# Patient Record
Sex: Female | Born: 1975 | Race: White | Hispanic: No | Marital: Married | Smoking: Never smoker
Health system: Southern US, Community
[De-identification: ages and names within clinical notes are randomized; demographics above are authoritative.]

## PROBLEM LIST (undated history)

## (undated) DIAGNOSIS — J349 Unspecified disorder of nose and nasal sinuses: Secondary | ICD-10-CM

## (undated) DIAGNOSIS — R040 Epistaxis: Secondary | ICD-10-CM

## (undated) DIAGNOSIS — M79669 Pain in unspecified lower leg: Secondary | ICD-10-CM

## (undated) DIAGNOSIS — R05 Cough: Secondary | ICD-10-CM

## (undated) DIAGNOSIS — F329 Major depressive disorder, single episode, unspecified: Secondary | ICD-10-CM

## (undated) DIAGNOSIS — R059 Cough, unspecified: Secondary | ICD-10-CM

## (undated) DIAGNOSIS — F32A Depression, unspecified: Secondary | ICD-10-CM

## (undated) DIAGNOSIS — M792 Neuralgia and neuritis, unspecified: Secondary | ICD-10-CM

## (undated) DIAGNOSIS — F419 Anxiety disorder, unspecified: Secondary | ICD-10-CM

## (undated) DIAGNOSIS — K219 Gastro-esophageal reflux disease without esophagitis: Secondary | ICD-10-CM

## (undated) DIAGNOSIS — R519 Headache, unspecified: Secondary | ICD-10-CM

## (undated) DIAGNOSIS — R51 Headache: Secondary | ICD-10-CM

## (undated) DIAGNOSIS — K589 Irritable bowel syndrome without diarrhea: Secondary | ICD-10-CM

## (undated) HISTORY — DX: Headache, unspecified: R51.9

## (undated) HISTORY — DX: Gastro-esophageal reflux disease without esophagitis: K21.9

## (undated) HISTORY — DX: Irritable bowel syndrome, unspecified: K58.9

## (undated) HISTORY — DX: Anxiety disorder, unspecified: F41.9

## (undated) HISTORY — PX: MOUTH SURGERY: SHX715

## (undated) HISTORY — DX: Cough: R05

## (undated) HISTORY — DX: Epistaxis: R04.0

## (undated) HISTORY — PX: OTHER SURGICAL HISTORY: SHX169

## (undated) HISTORY — DX: Cough, unspecified: R05.9

## (undated) HISTORY — DX: Unspecified disorder of nose and nasal sinuses: J34.9

## (undated) HISTORY — DX: Pain in unspecified lower leg: M79.669

## (undated) HISTORY — DX: Headache: R51

---

## 1998-10-14 ENCOUNTER — Encounter: Admission: RE | Admit: 1998-10-14 | Discharge: 1998-10-14 | Payer: Self-pay | Admitting: *Deleted

## 1999-12-23 ENCOUNTER — Encounter: Admission: RE | Admit: 1999-12-23 | Discharge: 1999-12-23 | Payer: Self-pay | Admitting: Family Medicine

## 1999-12-23 ENCOUNTER — Encounter: Payer: Self-pay | Admitting: Family Medicine

## 2000-02-25 ENCOUNTER — Other Ambulatory Visit: Admission: RE | Admit: 2000-02-25 | Discharge: 2000-02-25 | Payer: Self-pay | Admitting: *Deleted

## 2000-03-02 ENCOUNTER — Ambulatory Visit (HOSPITAL_COMMUNITY): Admission: RE | Admit: 2000-03-02 | Discharge: 2000-03-02 | Payer: Self-pay | Admitting: Obstetrics and Gynecology

## 2000-12-04 ENCOUNTER — Inpatient Hospital Stay (HOSPITAL_COMMUNITY): Admission: AD | Admit: 2000-12-04 | Discharge: 2000-12-04 | Payer: Self-pay | Admitting: Obstetrics and Gynecology

## 2001-03-21 ENCOUNTER — Other Ambulatory Visit: Admission: RE | Admit: 2001-03-21 | Discharge: 2001-03-21 | Payer: Self-pay | Admitting: Obstetrics and Gynecology

## 2002-03-11 ENCOUNTER — Inpatient Hospital Stay (HOSPITAL_COMMUNITY): Admission: AD | Admit: 2002-03-11 | Discharge: 2002-03-11 | Payer: Self-pay | Admitting: Obstetrics & Gynecology

## 2002-04-05 ENCOUNTER — Other Ambulatory Visit: Admission: RE | Admit: 2002-04-05 | Discharge: 2002-04-05 | Payer: Self-pay | Admitting: Obstetrics and Gynecology

## 2002-04-08 ENCOUNTER — Inpatient Hospital Stay (HOSPITAL_COMMUNITY): Admission: AD | Admit: 2002-04-08 | Discharge: 2002-04-08 | Payer: Self-pay | Admitting: Obstetrics and Gynecology

## 2002-05-06 ENCOUNTER — Inpatient Hospital Stay (HOSPITAL_COMMUNITY): Admission: AD | Admit: 2002-05-06 | Discharge: 2002-05-06 | Payer: Self-pay | Admitting: Obstetrics and Gynecology

## 2002-07-16 ENCOUNTER — Other Ambulatory Visit: Admission: RE | Admit: 2002-07-16 | Discharge: 2002-07-16 | Payer: Self-pay | Admitting: Obstetrics and Gynecology

## 2002-11-05 ENCOUNTER — Ambulatory Visit (HOSPITAL_COMMUNITY): Admission: RE | Admit: 2002-11-05 | Discharge: 2002-11-05 | Payer: Self-pay | Admitting: Obstetrics and Gynecology

## 2003-02-04 ENCOUNTER — Inpatient Hospital Stay (HOSPITAL_COMMUNITY): Admission: AD | Admit: 2003-02-04 | Discharge: 2003-02-07 | Payer: Self-pay | Admitting: Obstetrics and Gynecology

## 2003-02-08 ENCOUNTER — Encounter: Admission: RE | Admit: 2003-02-08 | Discharge: 2003-03-10 | Payer: Self-pay | Admitting: Obstetrics and Gynecology

## 2003-03-11 ENCOUNTER — Encounter: Admission: RE | Admit: 2003-03-11 | Discharge: 2003-04-10 | Payer: Self-pay | Admitting: Obstetrics and Gynecology

## 2003-03-24 ENCOUNTER — Other Ambulatory Visit: Admission: RE | Admit: 2003-03-24 | Discharge: 2003-03-24 | Payer: Self-pay | Admitting: Obstetrics and Gynecology

## 2003-05-11 ENCOUNTER — Encounter: Admission: RE | Admit: 2003-05-11 | Discharge: 2003-06-10 | Payer: Self-pay | Admitting: Obstetrics and Gynecology

## 2004-01-26 ENCOUNTER — Other Ambulatory Visit: Admission: RE | Admit: 2004-01-26 | Discharge: 2004-01-26 | Payer: Self-pay | Admitting: Obstetrics and Gynecology

## 2004-08-06 ENCOUNTER — Other Ambulatory Visit: Admission: RE | Admit: 2004-08-06 | Discharge: 2004-08-06 | Payer: Self-pay | Admitting: Obstetrics and Gynecology

## 2005-04-04 ENCOUNTER — Other Ambulatory Visit: Admission: RE | Admit: 2005-04-04 | Discharge: 2005-04-04 | Payer: Self-pay | Admitting: Obstetrics and Gynecology

## 2012-05-25 ENCOUNTER — Other Ambulatory Visit: Payer: Self-pay | Admitting: Obstetrics and Gynecology

## 2012-05-25 DIAGNOSIS — N644 Mastodynia: Secondary | ICD-10-CM

## 2014-06-11 ENCOUNTER — Inpatient Hospital Stay: Admission: RE | Admit: 2014-06-11 | Payer: Self-pay | Source: Ambulatory Visit

## 2014-06-11 ENCOUNTER — Ambulatory Visit (INDEPENDENT_AMBULATORY_CARE_PROVIDER_SITE_OTHER): Payer: No Typology Code available for payment source

## 2014-06-11 VITALS — BP 116/76 | HR 112 | Resp 18

## 2014-06-11 DIAGNOSIS — M722 Plantar fascial fibromatosis: Secondary | ICD-10-CM

## 2014-06-11 DIAGNOSIS — M79609 Pain in unspecified limb: Secondary | ICD-10-CM

## 2014-06-11 DIAGNOSIS — M79606 Pain in leg, unspecified: Secondary | ICD-10-CM

## 2014-06-11 MED ORDER — MELOXICAM 15 MG PO TABS: 15.0000 mg | ORAL_TABLET | Freq: Every day | ORAL | Status: DC

## 2014-06-11 NOTE — Progress Notes (Signed)
   Subjective:    Patient ID: Milus HeightAngela L Fandrich, female    DOB: [birth year], 38 y.o.   MRN: [id number]  HPI RIGHT HEEL HAS BEEN GOING ON FOR ABOUT 2 MONTHS AND I STRETCH IT AND ICE AND BURNS AND THROBS AND SORE AND TENDER AND HURTS ON BOTTOM AND SIDE AND THE ARCH AREA     Review of Systems  HENT: Positive for sinus pressure.   Respiratory: Positive for cough and chest tightness.   Cardiovascular:       Calf pain with walking   Allergic/Immunologic: Positive for environmental allergies.  All other systems reviewed and are negative.      Objective:   Physical Exam 38 year old white female well-developed well-nourished oriented x3 presents at this time for complaint of right foot and heel pain and arch pain. Been going on for at least 2 more months she's tried wearing cam boot that she got for mother who is treated for fasciitis. Been doing ice and stretching providing temporary relief however continues to have pain currently wearing a pair of flip-flop sandals [employer] at work or goes barefoot around the house.  Lower extremity objective findings vascular status is intact with pedal pulses palpable DP is two over four bilateral capillary refill time 3 seconds all digits epicritic and proprioceptive sensations intact and symmetric there is normal plantar response DTRs not listed dermatologically skin color pigment normal hair growth absent nails somewhat criptotic there is no open wounds ulcerations no signs of infection at this time orthopedic biomechanical exam reveals pain tenderness on palpation medial band plantar fascia medial calcaneal tubercle and arch area mid arch x-rays reveal no inferior calcaneal spurring thickening the fashion identified patient is also having a lot of pain along the lateral fifth metatarsal head and base possibly due to history gait changes walking outside of the foot also having some calf her Achilles pain posse do compensatory gait changes as well       Assessment  & Plan:  Assessment plantar fasciitis/heel spur syndrome right foot plan at this time patient placed in fascial strapping the right foot maintained for 5 days as instructed [facility] 150 mg tablet daily dispensed 30 with one refill. Patient also apply ice as instructed and maintain a good stable shoe suggested crocs for around the house no barefoot or flimsy shoes or flip-flops reappointed in 2-3 weeks for followup reevaluation may be candidate in the future for orthoses next  Alvan Dameichard Anastashia Westerfeld DPM

## 2014-06-11 NOTE — Patient Instructions (Signed)
Plantar Fasciitis Plantar fasciitis is a common condition that causes foot pain. It is soreness (inflammation) of the band of tough fibrous tissue on the bottom of the foot that runs from the heel bone (calcaneus) to the ball of the foot. The cause of this soreness may be from excessive standing, poor fitting shoes, running on hard surfaces, being overweight, having an abnormal walk, or overuse (this is common in runners) of the painful foot or feet. It is also common in aerobic exercise dancers and ballet dancers. SYMPTOMS  Most people with plantar fasciitis complain of:  Severe pain in the morning on the bottom of their foot especially when taking the first steps out of bed. This pain recedes after a few minutes of walking.  Severe pain is experienced also during walking following a long period of inactivity.  Pain is worse when walking barefoot or up stairs DIAGNOSIS   Your caregiver will diagnose this condition by examining and feeling your foot.  Special tests such as X-rays of your foot, are usually not needed. PREVENTION   Consult a sports medicine professional before beginning a new exercise program.  Walking programs offer a good workout. With walking there is a lower chance of overuse injuries common to runners. There is less impact and less jarring of the joints.  Begin all new exercise programs slowly. If problems or pain develop, decrease the amount of time or distance until you are at a comfortable level.  Wear good shoes and replace them regularly.  Stretch your foot and the heel cords at the back of the ankle (Achilles tendon) both before and after exercise.  Run or exercise on even surfaces that are not hard. For example, asphalt is better than pavement.  Do not run barefoot on hard surfaces.  If using a treadmill, vary the incline.  Do not continue to workout if you have foot or joint problems. Seek professional help if they do not improve. HOME CARE INSTRUCTIONS     Avoid activities that cause you pain until you recover.  Use ice or cold packs on the problem or painful areas after working out.  Only take over-the-counter or prescription medicines for pain, discomfort, or fever as directed by your caregiver.  Soft shoe inserts or athletic shoes with air or gel sole cushions may be helpful.  If problems continue or become more severe, consult a sports medicine caregiver or your own health care provider. Cortisone is a potent anti-inflammatory medication that may be injected into the painful area. You can discuss this treatment with your caregiver. MAKE SURE YOU:   Understand these instructions.  Will watch your condition.  Will get help right away if you are not doing well or get worse. Document Released: 08/09/2001 Document Revised: 02/06/2012 Document Reviewed: 10/08/2008 Exit[employer] Patient Information 2015 [employer]. This information is not intended to replace advice given to you by your health care provider. Make sure you discuss any questions you have with your health care provider.    ICE INSTRUCTIONS  Apply ice or cold pack to the affected area at least 3 times a day for 10-15 minutes each time.  You should also use ice after prolonged activity or vigorous exercise.  Do not apply ice longer than 20 minutes at one time.  Always keep a cloth between your skin and the ice pack to prevent burns.  Being consistent and following these instructions will help control your symptoms.  We suggest you purchase a gel ice pack because they are   reusable and do bit leak.  Some of them are designed to wrap around the area.  Use the method that works best for you.  Here are some other suggestions for icing.   Use a frozen bag of peas or corn-inexpensive and molds well to your body, usually stays frozen for 10 to 20 minutes.  Wet a towel with cold water and squeeze out the excess until it's damp.  Place in a bag in the freezer for 20 minutes. Then remove  and use. 

## 2014-07-09 ENCOUNTER — Ambulatory Visit (INDEPENDENT_AMBULATORY_CARE_PROVIDER_SITE_OTHER): Payer: No Typology Code available for payment source

## 2014-07-09 VITALS — BP 137/83 | HR 131 | Resp 18

## 2014-07-09 DIAGNOSIS — M79609 Pain in unspecified limb: Secondary | ICD-10-CM

## 2014-07-09 DIAGNOSIS — M722 Plantar fascial fibromatosis: Secondary | ICD-10-CM

## 2014-07-09 DIAGNOSIS — M79606 Pain in leg, unspecified: Secondary | ICD-10-CM

## 2014-07-09 MED ORDER — TRIAMCINOLONE ACETONIDE 10 MG/ML IJ SUSP: 10.0000 mg | Freq: Once | INTRAMUSCULAR | Status: AC

## 2014-07-09 NOTE — Patient Instructions (Signed)

## 2014-07-09 NOTE — Progress Notes (Signed)
   Subjective:    Patient ID: Milus HeightAngela L Woodmansee, female    DOB: 06/12/1976, 38 y.o.   MRN: [id number]  HPI MY RIGHT HEEL IS STILL SORE AND TENDER AND IS MORE IN THE HEEL AND THE MEDICINE IS MAKING MY STOMACH HURT AND I CAN'T AFFORD TO BUY A BUNCH OF SHOES    Review of Systems no new findings or systemic changes noted     Objective:   Physical Exam Lower extremity objective findings unchanged pedal pulses are palpable patient case normal taping was done for better taking with full 5 days [place] is afterwards last different shoes continues to have pain more in the right foot than left medial calcaneal tubercle medial arch area right patient again pedal pulses palpable epicritic sensations intact biomechanically significant promontory changes of pain tenderness the plantar fascial insertion and medial calcaneal tubercle. At this time recommended more aggressive treatment medications as the NSAIDs are causing GI upset recommend a steroid injection which was delivered today tender with Kenalog 20 mg Marcaine plain infiltrated to the inferior calcaneal area right heel.       Assessment & Plan:  Assessment plantar fasciitis/heel spur syndrome response to taping should respond to functional orthoses insurance does not cover orthotics this time we'll range for a power step orthotic we do not have appropriate orthotic for her the soft tissue pickup orthotic either later today or tomorrow the [place] office as she travels through [place] for her work. Will get partially power step orthotics dispensed in [place] either today or tomorrow as instructed orthotic instruction sheet given at this time after an injection of Kenalog 10 mg infiltrated to the radial recommended ice to the affected area recheck in one to 2 months for orthotic check adjustments if needed also continue maintain a good stable shoe preferably lace up or athletic rather than flats  Alvan Dameichard Laken Lobato DPM

## 2014-10-02 ENCOUNTER — Other Ambulatory Visit: Payer: Self-pay | Admitting: Obstetrics and Gynecology

## 2014-10-03 LAB — CYTOLOGY - PAP

## 2015-08-25 ENCOUNTER — Telehealth: Payer: Self-pay | Admitting: Physician Assistant

## 2015-08-25 NOTE — Telephone Encounter (Signed)
Patient contacted office requesting lab result

## 2015-08-27 NOTE — Telephone Encounter (Signed)
I spoke with patient about her lab results and she expressed understanding. 

## 2016-06-14 ENCOUNTER — Ambulatory Visit: Payer: Self-pay | Admitting: Physician Assistant

## 2016-06-14 VITALS — BP 119/70 | HR 120 | Temp 98.7°F

## 2016-06-14 DIAGNOSIS — M545 Low back pain, unspecified: Secondary | ICD-10-CM

## 2016-06-14 DIAGNOSIS — R300 Dysuria: Secondary | ICD-10-CM

## 2016-06-14 NOTE — Progress Notes (Signed)
S: left low back pain    No other sx O: urine negative A: muscle skeletal pain P: ibu tid and heat/ice to back

## 2016-06-15 LAB — POCT URINALYSIS DIPSTICK
Bilirubin, UA: NEGATIVE
Blood, UA: NEGATIVE
GLUCOSE UA: NEGATIVE
KETONES UA: NEGATIVE
Leukocytes, UA: NEGATIVE
Nitrite, UA: NEGATIVE
PROTEIN UA: NEGATIVE
SPEC GRAV UA: 1.02
Urobilinogen, UA: 0.2
pH, UA: 6

## 2016-10-14 ENCOUNTER — Encounter: Payer: Self-pay | Admitting: Physician Assistant

## 2016-10-14 ENCOUNTER — Ambulatory Visit: Payer: Self-pay | Admitting: Physician Assistant

## 2016-10-14 VITALS — BP 132/88 | HR 80 | Temp 98.2°F

## 2016-10-14 DIAGNOSIS — M5440 Lumbago with sciatica, unspecified side: Secondary | ICD-10-CM

## 2016-10-14 MED ORDER — CYCLOBENZAPRINE HCL 5 MG PO TABS: 5.0000 mg | ORAL_TABLET | Freq: Three times a day (TID) | ORAL | 1 refills | Status: AC | PRN

## 2016-10-14 MED ORDER — PREDNISONE 10 MG (48) PO TBPK: ORAL_TABLET | Freq: Every day | ORAL | 0 refills | Status: DC

## 2016-10-14 MED ORDER — KETOROLAC TROMETHAMINE 60 MG/2ML IM SOLN
60.0000 mg | Freq: Once | INTRAMUSCULAR | Status: AC
Start: 2016-10-14 — End: 2016-10-14
  Administered 2016-10-14: 60 mg via INTRAMUSCULAR

## 2016-10-14 NOTE — Patient Instructions (Addendum)
Sciatica Introduction Sciatica is pain, numbness, weakness, or tingling along your sciatic nerve. The sciatic nerve starts in the lower back and goes down the back of each leg. Sciatica happens when this nerve is pinched or has pressure put on it. Sciatica usually goes away on its own or with treatment. Sometimes, sciatica may keep coming back (recur). Follow these instructions at home: Medicines  Take over-the-counter and prescription medicines only as told by your doctor.  Do not drive or use heavy machinery while taking prescription pain medicine. Managing pain  If directed, put ice on the affected area.  Put ice in a plastic bag.  Place a towel between your skin and the bag.  Leave the ice on for 20 minutes, 2-3 times a day.  After icing, apply heat to the affected area before you exercise or as often as told by your doctor. Use the heat source that your doctor tells you to use, such as a moist heat pack or a heating pad.  Place a towel between your skin and the heat source.  Leave the heat on for 20-30 minutes.  Remove the heat if your skin turns bright red. This is especially important if you are unable to feel pain, heat, or cold. You may have a greater risk of getting burned. Activity  Return to your normal activities as told by your doctor. Ask your doctor what activities are safe for you.  Avoid activities that make your sciatica worse.  Take short rests during the day. Rest in a lying or standing position. This is usually better than sitting to rest.  When you rest for a long time, do some physical activity or stretching between periods of rest.  Avoid sitting for a long time without moving. Get up and move around at least one time each hour.  Exercise and stretch regularly, as told by your doctor.  Do not lift anything that is heavier than 10 lb (4.5 kg) while you have symptoms of sciatica.  Avoid lifting heavy things even when you do not have symptoms.  Avoid  lifting heavy things over and over.  When you lift objects, always lift in a way that is safe for your body. To do this, you should:  Bend your knees.  Keep the object close to your body.  Avoid twisting. General instructions  Use good posture.  Avoid leaning forward when you are sitting.  Avoid hunching over when you are standing.  Stay at a healthy weight.  Wear comfortable shoes that support your feet. Avoid wearing high heels.  Avoid sleeping on a mattress that is too soft or too hard. You might have less pain if you sleep on a mattress that is firm enough to support your back.  Keep all follow-up visits as told by your doctor. This is important. Contact a doctor if:  You have pain that:  Wakes you up when you are sleeping.  Gets worse when you lie down.  Is worse than the pain you have had in the past.  Lasts longer than 4 weeks.  You lose weight for without trying. Get help right away if:  You cannot control when you pee (urinate) or poop (have a bowel movement).  You have weakness in any of these areas and it gets worse.  Lower back.  Lower belly (pelvis).  Butt (buttocks).  Legs.  You have redness or swelling of your back.  You have a burning feeling when you pee. This information is not intended to   replace advice given to you by your health care provider. Make sure you discuss any questions you have with your health care provider. Document Released: 08/23/2008 Document Revised: 04/21/2016 Document Reviewed: 07/24/2015  2017 [employer]  Back Exercises Introduction If you have pain in your back, do these exercises 2-3 times each day or as told by your doctor. When the pain goes away, do the exercises once each day, but repeat the steps more times for each exercise (do more repetitions). If you do not have pain in your back, do these exercises once each day or as told by your doctor. Exercises Single Knee to Chest  Do these steps 3-5 times in a row  for each leg: 1. Lie on your back on a firm bed or the floor with your legs stretched out. 2. Bring one knee to your chest. 3. Hold your knee to your chest by grabbing your knee or thigh. 4. Pull on your knee until you feel a gentle stretch in your lower back. 5. Keep doing the stretch for 10-30 seconds. 6. Slowly let go of your leg and straighten it. Pelvic Tilt  Do these steps 5-10 times in a row: 1. Lie on your back on a firm bed or the floor with your legs stretched out. 2. Bend your knees so they point up to the ceiling. Your feet should be flat on the floor. 3. Tighten your lower belly (abdomen) muscles to press your lower back against the floor. This will make your tailbone point up to the ceiling instead of pointing down to your feet or the floor. 4. Stay in this position for 5-10 seconds while you gently tighten your muscles and breathe evenly. Cat-Cow  Do these steps until your lower back bends more easily: 1. Get on your hands and knees on a firm surface. Keep your hands under your shoulders, and keep your knees under your hips. You may put padding under your knees. 2. Let your head hang down, and make your tailbone point down to the floor so your lower back is round like the back of a cat. 3. Stay in this position for 5 seconds. 4. Slowly lift your head and make your tailbone point up to the ceiling so your back hangs low (sags) like the back of a cow. 5. Stay in this position for 5 seconds. Press-Ups  Do these steps 5-10 times in a row: 1. Lie on your belly (face-down) on the floor. 2. Place your hands near your head, about shoulder-width apart. 3. While you keep your back relaxed and keep your hips on the floor, slowly straighten your arms to raise the top half of your body and lift your shoulders. Do not use your back muscles. To make yourself more comfortable, you may change where you place your hands. 4. Stay in this position for 5 seconds. 5. Slowly return to lying flat on  the floor. Bridges  Do these steps 10 times in a row: 1. Lie on your back on a firm surface. 2. Bend your knees so they point up to the ceiling. Your feet should be flat on the floor. 3. Tighten your butt muscles and lift your butt off of the floor until your waist is almost as high as your knees. If you do not feel the muscles working in your butt and the back of your thighs, slide your feet 1-2 inches farther away from your butt. 4. Stay in this position for 3-5 seconds. 5. Slowly lower your butt to   the floor, and let your butt muscles relax. If this exercise is too easy, try doing it with your arms crossed over your chest. Belly Crunches  Do these steps 5-10 times in a row: 1. Lie on your back on a firm bed or the floor with your legs stretched out. 2. Bend your knees so they point up to the ceiling. Your feet should be flat on the floor. 3. Cross your arms over your chest. 4. Tip your chin a little bit toward your chest but do not bend your neck. 5. Tighten your belly muscles and slowly raise your chest just enough to lift your shoulder blades a tiny bit off of the floor. 6. Slowly lower your chest and your head to the floor. Back Lifts  Do these steps 5-10 times in a row: 1. Lie on your belly (face-down) with your arms at your sides, and rest your forehead on the floor. 2. Tighten the muscles in your legs and your butt. 3. Slowly lift your chest off of the floor while you keep your hips on the floor. Keep the back of your head in line with the curve in your back. Look at the floor while you do this. 4. Stay in this position for 3-5 seconds. 5. Slowly lower your chest and your face to the floor. Contact a doctor if:  Your back pain gets a lot worse when you do an exercise.  Your back pain does not lessen 2 hours after you exercise. If you have any of these problems, stop doing the exercises. Do not do them again unless your doctor says it is okay. Get help right away if:  You have  sudden, very bad back pain. If this happens, stop doing the exercises. Do not do them again unless your doctor says it is okay. This information is not intended to replace advice given to you by your health care provider. Make sure you discuss any questions you have with your health care provider. Document Released: 12/17/2010 Document Revised: 04/21/2016 Document Reviewed: 01/08/2015  2017 [employer]vier  

## 2016-10-14 NOTE — Progress Notes (Signed)
S:  C/o low back pain for 5 days, no known injury, pain is worse with movement, increased with bending over, pain is in left buttock and radiates down left leg,  denies numbness, tingling, or changes in bowel/urinary habits,  Using otc meds without relief, naprosyn making her drowsy Remainder ros neg  O:  Vitals wnl, nad, lungs c t a, cv rrr, spine nontender, muscles in lower back spasmed , tender in left buttock, pain reproduced with deep palpation of left buttock,  Neg slr, pt walks without difficulty, no foot drop noted, n/v intact  A: acute back pain with sciatica, muscle spasms  P: use wet heat followed by ice, stretches, return to clinic if not better in 3 t 5 days, return earlier if worsening, rx meds: sterapred ds 10mg  12 d dose pack, flexeril 5mg 

## 2016-11-04 ENCOUNTER — Other Ambulatory Visit: Payer: Self-pay | Admitting: Sports Medicine

## 2016-11-04 DIAGNOSIS — M545 Low back pain, unspecified: Secondary | ICD-10-CM

## 2016-11-04 DIAGNOSIS — M5416 Radiculopathy, lumbar region: Secondary | ICD-10-CM

## 2016-11-13 ENCOUNTER — Ambulatory Visit
Admission: RE | Admit: 2016-11-13 | Discharge: 2016-11-13 | Disposition: A | Discharge status: Home / Self Care | Payer: Managed Care, Other (non HMO) | Source: Ambulatory Visit | Attending: Sports Medicine | Admitting: Sports Medicine

## 2016-11-13 DIAGNOSIS — M545 Low back pain, unspecified: Secondary | ICD-10-CM

## 2016-11-13 DIAGNOSIS — M5416 Radiculopathy, lumbar region: Secondary | ICD-10-CM

## 2017-05-11 ENCOUNTER — Ambulatory Visit: Payer: Self-pay | Admitting: Physician Assistant

## 2017-05-11 ENCOUNTER — Encounter: Payer: Self-pay | Admitting: Physician Assistant

## 2017-05-11 VITALS — BP 125/75 | HR 89 | Temp 98.5°F | Resp 16 | Ht 69.0 in | Wt 309.0 lb

## 2017-05-11 DIAGNOSIS — Z0000 Encounter for general adult medical examination without abnormal findings: Secondary | ICD-10-CM

## 2017-05-11 NOTE — Progress Notes (Signed)
   Subjective:Physical exam    Patient ID: Regina Whitaker, female    DOB: 03-28-1976, 41 y.o.   MRN: [id number]  HPI Patient present for annual exam.   Review of Systems Obesity and chronic back pain    Objective:   Physical Exam Morbid Obesity. HEENT unremarkable. Neck supple w/o adenopathy. Lung CTA and Heart RRR. No obvious upper/lower Extremity deformities. F/E ROM. Abdominal exam limited by patient body habitus. No obvious Spinal deformity. Decrease ROM with flexion of lumbar spinal. Negative straight leg tes. CN II-XII grossly intact.       Assessment & Plan:Well exam  Follow up lab result.

## 2018-02-22 ENCOUNTER — Encounter: Payer: Self-pay | Admitting: Family Medicine

## 2018-02-22 ENCOUNTER — Ambulatory Visit
Admission: RE | Admit: 2018-02-22 | Discharge: 2018-02-22 | Disposition: A | Discharge status: Home / Self Care | Payer: Managed Care, Other (non HMO) | Source: Ambulatory Visit | Attending: Family Medicine | Admitting: Family Medicine

## 2018-02-22 ENCOUNTER — Ambulatory Visit: Payer: Self-pay | Admitting: Family Medicine

## 2018-02-22 VITALS — BP 141/98 | HR 98 | Temp 97.7°F | Resp 18

## 2018-02-22 DIAGNOSIS — R0602 Shortness of breath: Secondary | ICD-10-CM

## 2018-02-22 DIAGNOSIS — J329 Chronic sinusitis, unspecified: Secondary | ICD-10-CM

## 2018-02-22 DIAGNOSIS — R059 Cough, unspecified: Secondary | ICD-10-CM

## 2018-02-22 DIAGNOSIS — R05 Cough: Secondary | ICD-10-CM

## 2018-02-22 DIAGNOSIS — J45901 Unspecified asthma with (acute) exacerbation: Secondary | ICD-10-CM

## 2018-02-22 LAB — POCT INFLUENZA A/B
INFLUENZA B, POC: NEGATIVE
Influenza A, POC: NEGATIVE

## 2018-02-22 MED ORDER — BENZONATATE 100 MG PO CAPS: 100.0000 mg | ORAL_CAPSULE | Freq: Three times a day (TID) | ORAL | 0 refills | Status: DC | PRN

## 2018-02-22 MED ORDER — PREDNISONE 20 MG PO TABS: 40.0000 mg | ORAL_TABLET | Freq: Every day | ORAL | 0 refills | Status: AC

## 2018-02-22 MED ORDER — IPRATROPIUM-ALBUTEROL 0.5-2.5 (3) MG/3ML IN SOLN: 3.0000 mL | Freq: Once | RESPIRATORY_TRACT | Status: AC

## 2018-02-22 MED ORDER — AMOXICILLIN-POT CLAVULANATE 875-125 MG PO TABS: 1.0000 | ORAL_TABLET | Freq: Two times a day (BID) | ORAL | 0 refills | Status: AC

## 2018-02-22 NOTE — Progress Notes (Signed)
Subjective: Cough     Regina Whitaker is a 42 y.o. female who presents for dyspnea, non-productive cough, fatigue, sore throat, fever, nasal congestion with purulent nasal discharge, nausea, facial pressure/dental pressure, body aches, and wheezing for 4 days.  Patient reports in the last 24 hours she has developed wheezing and shortness of breath.  Denies chest or back pain.  Patient reports that on Monday and Tuesday she had a low-grade fever between 100.0 and 101.0.  Reports fever never exceeded 101.0.  Denies recurrence of fever.  The patient has been previously diagnosed with asthma but reports she only has symptoms with exercise and when she gets upper respiratory infections.  Patient does not use her albuterol inhaler otherwise.  Gets good relief with her albuterol inhaler in these circumstances.  Patient has used her albuterol inhaler multiple times a day since the onset of her symptoms 4 days ago.  Patient also reports being outside all day Saturday.  Patient is not sure if she is ever had an asthma exacerbation requiring oral corticosteroids.  Denies ever being hospitalized or intubated for asthma.  Aside from pretreatment for exercise, patient has not used her albuterol inhaler in the last year.  Patient is not taking any other medications for asthma. Upon arrival patient had generalized wheezing and was short of breath.  DuoNeb administered which provided significant relief and improvement in symptoms.  Lungs clear following DuoNeb.  Treatment to date: Albuterol and NyQuil.  Denies rash, vomiting, diarrhea, ear pain, difficulty swallowing, confusion, AMS, initial improvement and then worsening of symptoms. Denies any history of smoking. History of recurrent sinus and/or lung infections: Negative. Antibiotic use in the last 3 months: Negative.  Review of Systems Pertinent items noted in HPI and remainder of comprehensive ROS otherwise negative.    Objective:    Oxygen saturation 98% on room  air  Physical Exam General: Awake, alert, and oriented. No acute distress. Well developed, hydrated and nourished. Appears stated age. Nontoxic appearance.  HEENT:  PND noted.  Mild erythema to posterior oropharynx.  No edema or exudates of pharynx or tonsils. No erythema or bulging of TM.  Mild erythema/edema to nasal mucosa.  Left maxillary sinus tenderness.  Supple neck without adenopathy. Cardiac: Heart rate and rhythm are normal. No murmurs, gallops, or rubs are auscultated. S1 and S2 are heard and are of normal intensity.  Heart rate upon arrival 112.  Repeat assessment 30 minutes following breathing treatment: Heart rate 98/min. Respiratory: No signs of respiratory distress. Lungs clear following DuoNeb. No tachypnea. Able to speak in full sentences without dyspnea. Nonlabored respirations.  Skin: Skin is warm, dry and intact. Appropriate color for ethnicity. No cyanosis noted.   Diagnostic Results: Chest x-ray negative.  Flu swab negative.   Assessment:    Intermittent asthma. Apparent precipitants include exercise and upper respiratory infection. Treatment with DuoNeb was given in the office    Sinusitis.   Plan:    Warning signs of respiratory distress were reviewed with the patient.  Discussed avoidance of precipitants.   5-day course of prednisone prescribed. Patient reports not needing another albuterol inhaler, she has 2 at home.  Educated her on use of this. Prescribed Tessalon Perles to use only if needed to sleep.  Educated her not to overly suppress her cough as this increases her risk for pneumonia. Delayed antibiotic prescription for Augmentin prescribed for sinusitis: Patient given specific instructions on indications to fill this and agrees not to do this unless indicated.  Patient has  tolerated these medications well in the past. Discussed the diagnosis and treatment of sinusitis. Discussed the importance of avoiding unnecessary antibiotic therapy. Suggested  symptomatic OTC remedies. Nasal saline spray for congestion.   Adverse/side effects of all medications discussed. Follow-up with primary care provider. Discussed red flag symptoms and circumstances with which to seek medical care.   New Prescriptions   AMOXICILLIN-CLAVULANATE (AUGMENTIN) 875-125 MG TABLET    Take 1 tablet by mouth 2 (two) times daily for 10 days.   BENZONATATE (TESSALON) 100 MG CAPSULE    Take 1 capsule (100 mg total) by mouth 3 (three) times daily as needed for cough.   PREDNISONE (DELTASONE) 20 MG TABLET    Take 2 tablets (40 mg total) by mouth daily with breakfast for 5 days.

## 2018-09-06 ENCOUNTER — Encounter: Payer: Self-pay | Admitting: Intensive Care

## 2018-09-06 ENCOUNTER — Other Ambulatory Visit: Payer: Self-pay

## 2018-09-06 ENCOUNTER — Emergency Department
Admission: EM | Admit: 2018-09-06 | Discharge: 2018-09-06 | Discharge status: Against Medical Advice | Payer: Managed Care, Other (non HMO) | Attending: Emergency Medicine | Admitting: Emergency Medicine

## 2018-09-06 DIAGNOSIS — R040 Epistaxis: Secondary | ICD-10-CM | POA: Insufficient documentation

## 2018-09-06 DIAGNOSIS — Z5321 Procedure and treatment not carried out due to patient leaving prior to being seen by health care provider: Secondary | ICD-10-CM | POA: Diagnosis not present

## 2018-09-06 HISTORY — DX: Depression, unspecified: F32.A

## 2018-09-06 HISTORY — DX: Major depressive disorder, single episode, unspecified: F32.9

## 2018-09-06 HISTORY — DX: Neuralgia and neuritis, unspecified: M79.2

## 2018-09-06 MED ORDER — OXYMETAZOLINE HCL 0.05 % NA SOLN
NASAL | Status: AC
  Administered 2018-09-06: 1 via NASAL
  Filled 2018-09-06: qty 15

## 2018-09-06 MED ORDER — OXYMETAZOLINE HCL 0.05 % NA SOLN
1.0000 | Freq: Once | NASAL | Status: AC
  Administered 2018-09-06: 1 via NASAL

## 2018-09-06 NOTE — ED Triage Notes (Signed)
PAtient presents with nose bleed that started around 4:15pm. Presented with tampons in nose that was saturated in blood. Nose clamp now in place on patients nose

## 2018-09-06 NOTE — ED Notes (Signed)
Pt here with visitor states that the patient's nose has not bled in 30 minutes and is feeling good and is going home.  Encouraged pt to return to ED if necessary or needed further evaluation.

## 2018-12-12 DIAGNOSIS — K219 Gastro-esophageal reflux disease without esophagitis: Secondary | ICD-10-CM | POA: Insufficient documentation

## 2018-12-12 DIAGNOSIS — F419 Anxiety disorder, unspecified: Secondary | ICD-10-CM | POA: Insufficient documentation

## 2018-12-12 DIAGNOSIS — J4520 Mild intermittent asthma, uncomplicated: Secondary | ICD-10-CM | POA: Insufficient documentation

## 2018-12-28 ENCOUNTER — Other Ambulatory Visit: Payer: Self-pay

## 2019-01-02 ENCOUNTER — Other Ambulatory Visit: Payer: Self-pay

## 2019-02-18 IMAGING — CR DG CHEST 2V
1 series · 2 of 2 positions shown · non-contrast
Comparison: None in PACs

CLINICAL DATA: Cough, shortness of breath, chest congestion,
wheezing, body aches, and fatigue for the past 4 days. History of
asthma. Nonsmoker.

EXAM:
CHEST - 2 VIEW

[Series 1: dg chest 2 view · 0.14mm/px · 2 of 2 slices shown]
[im 1/2]
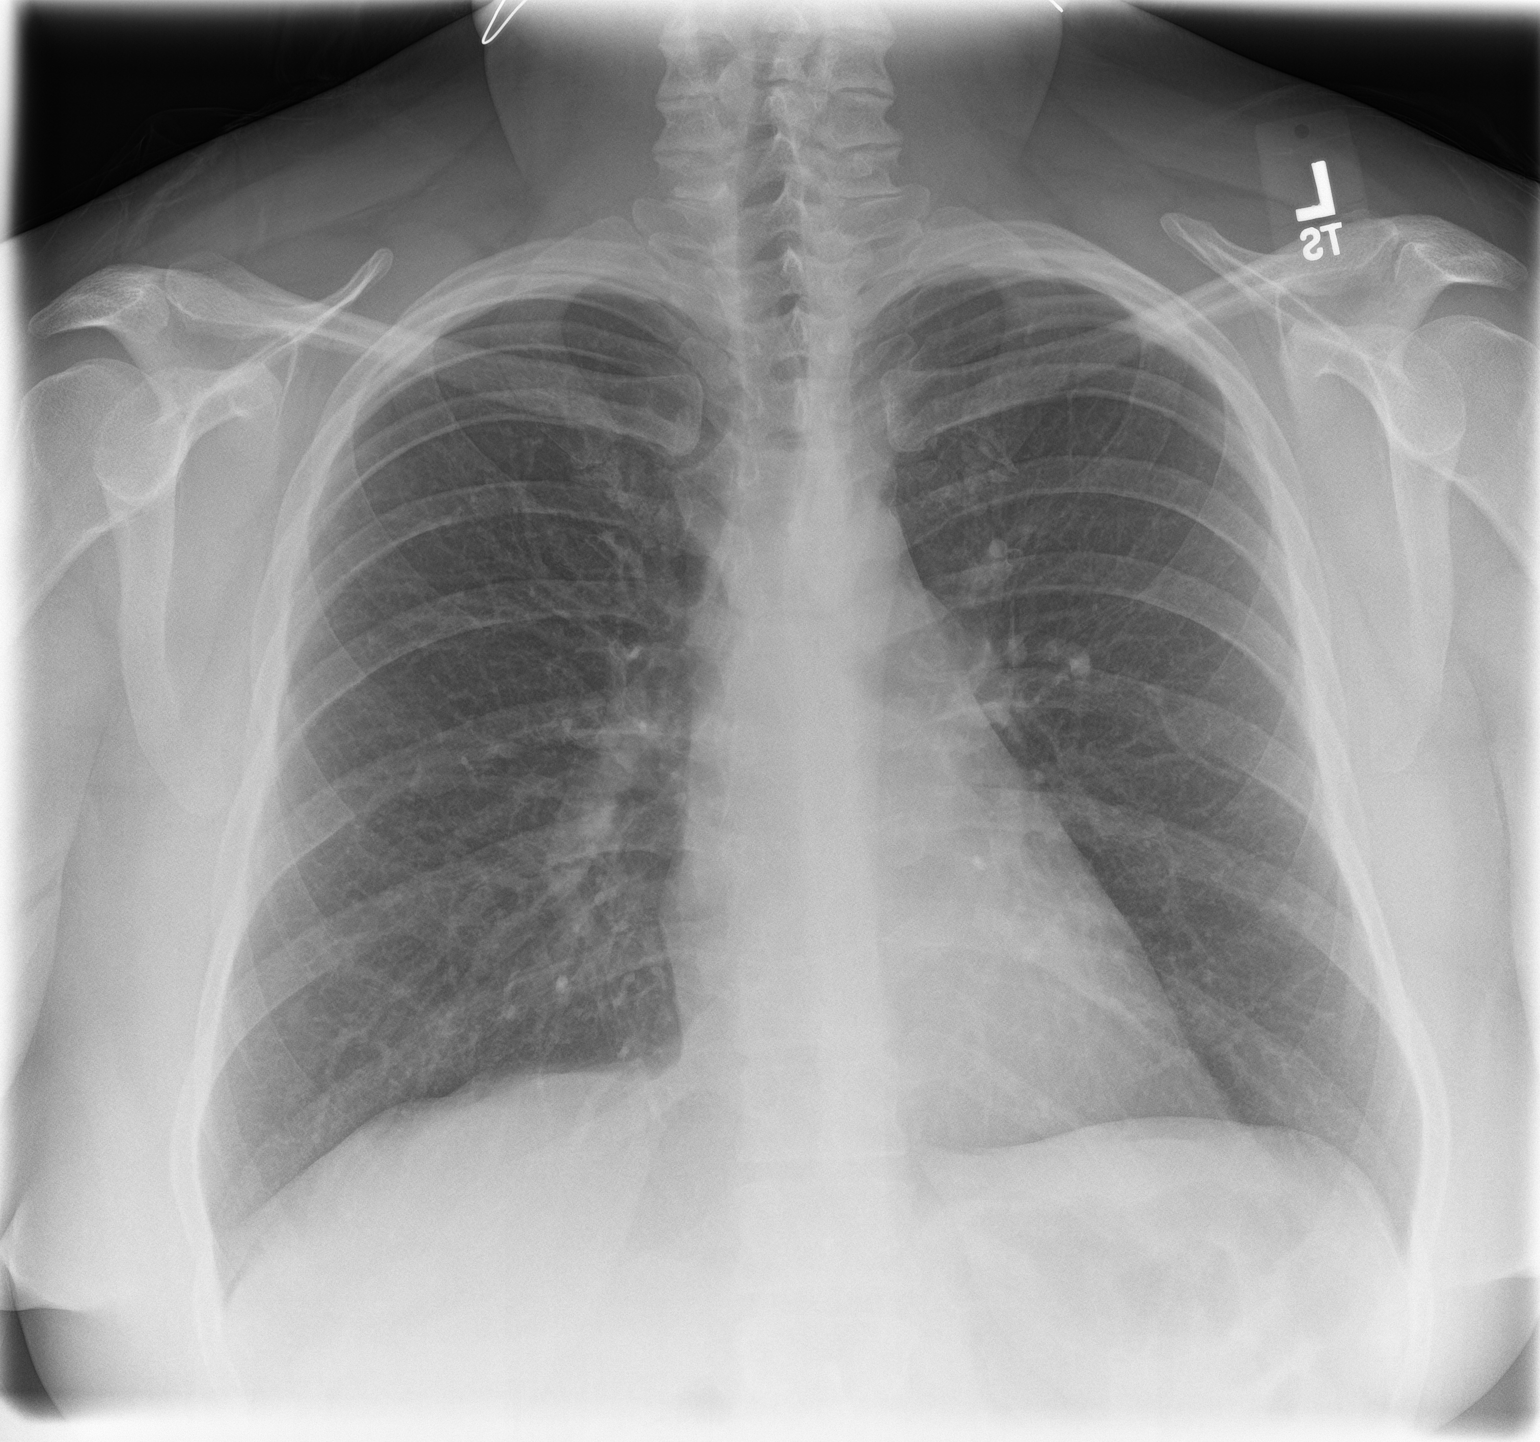
[im 2/2]
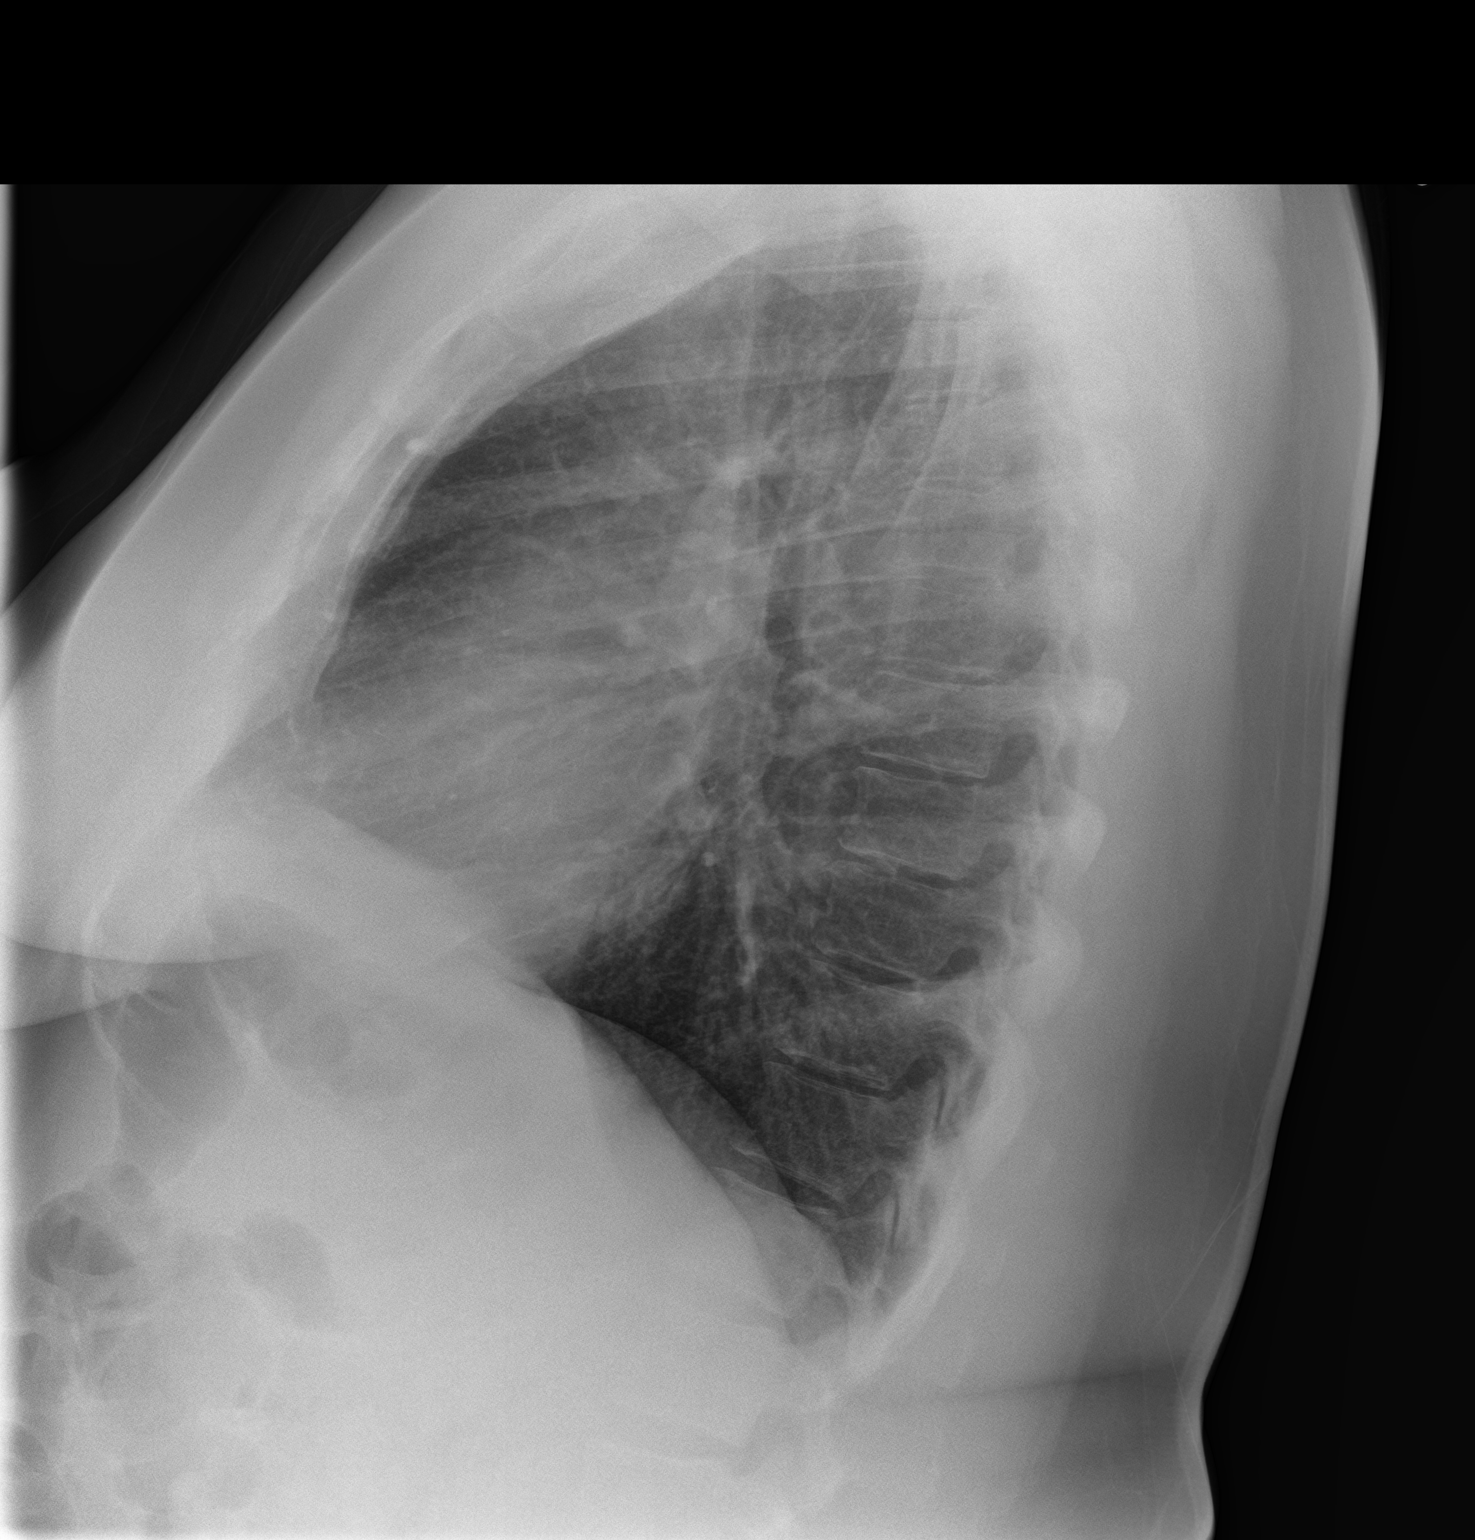

[2 of 2 positions shown; findings below may reference images not displayed]

FINDINGS: The lungs are adequately inflated and clear. The heart and pulmonary
vascularity are normal. The mediastinum is normal in width. The
trachea is midline. There is no pleural effusion. The bony thorax
exhibits no acute abnormality.
IMPRESSION: There is no active cardiopulmonary disease.

## 2019-04-26 DIAGNOSIS — R7303 Prediabetes: Secondary | ICD-10-CM | POA: Insufficient documentation

## 2019-05-20 DIAGNOSIS — R400 Somnolence: Secondary | ICD-10-CM | POA: Insufficient documentation

## 2019-07-04 ENCOUNTER — Encounter: Payer: Self-pay | Admitting: Adult Health

## 2019-07-04 ENCOUNTER — Other Ambulatory Visit: Payer: Self-pay

## 2019-07-04 ENCOUNTER — Ambulatory Visit: Payer: Managed Care, Other (non HMO) | Admitting: Adult Health

## 2019-07-04 VITALS — BP 118/86 | HR 96 | Temp 97.8°F | Ht 69.0 in | Wt 338.0 lb

## 2019-07-04 DIAGNOSIS — Z008 Encounter for other general examination: Secondary | ICD-10-CM | POA: Diagnosis not present

## 2019-07-04 DIAGNOSIS — Z0189 Encounter for other specified special examinations: Secondary | ICD-10-CM

## 2019-07-04 NOTE — Patient Instructions (Signed)
  I will have the office call you on your glucose and cholesterol results when they return if you have not heard within 1 week please call the office.  This biometric physical is a brief physical and the only labs done are glucose and your lipid panel(cholesterol) and is  not a substitute for seeing a primary care provider for a complete annual physical. Please see a primary care physician for routine health maintenance, labs and full physical at least yearly and follow up as recommended by your provider. Provider also recommends if you do not have a primary care provider for patient to establish care as soon as possible .Patient may chose provider of choice. Also gave the [place]  PHYSICIAN/PROVIDER  REFERRAL LINE at [phone] or web site at [email or web address] to help assist with finding a primary care doctor.  Patient verbalizes understanding that his office is acute care only and not a substitute for a primary care or for the management of chronic conditions.    Health Maintenance, Female Adopting a healthy lifestyle and getting preventive care are important in promoting health and wellness. Ask your health care provider about:  The right schedule for you to have regular tests and exams.  Things you can do on your own to prevent diseases and keep yourself healthy. What should I know about diet, weight, and exercise? Eat a healthy diet   Eat a diet that includes plenty of vegetables, fruits, low-fat dairy products, and lean protein.  Do not eat a lot of foods that are high in solid fats, added sugars, or sodium. Maintain a healthy weight Body mass index (BMI) is used to identify weight problems. It estimates body fat based on height and weight. Your health care provider can help determine your BMI and help you achieve or maintain a healthy weight. Get regular exercise Get regular exercise. This is one of the most important things you can do for your health. Most adults should:   Exercise for at least 150 minutes each week. The exercise should increase your heart rate and make you sweat (moderate-intensity exercise).  Do strengthening exercises at least twice a week. This is in addition to the moderate-intensity exercise.  Spend less time sitting. Even light physical activity can be beneficial. Watch cholesterol and blood lipids Have your blood tested for lipids and cholesterol at 44 years of age, then have this test every 5 years. Have your cholesterol levels checked more often if:  Your lipid or cholesterol levels are high.  You are older than 44 years of age.  You are at high risk for heart disease. What should I know about cancer screening? Depending on your health history and family history, you may need to have cancer screening at various ages. This may include screening for:  Breast cancer.  Cervical cancer.  Colorectal cancer.  Skin cancer.  Lung cancer. What should I know about heart disease, diabetes, and high blood pressure? Blood pressure and heart disease  High blood pressure causes heart disease and increases the risk of stroke. This is more likely to develop in people who have high blood pressure readings, are of African descent, or are overweight.  Have your blood pressure checked: ? Every 3-5 years if you are 18-39 years of age. ? Every year if you are 40 years old or older. Diabetes Have regular diabetes screenings. This checks your fasting blood sugar level. Have the screening done:  Once every three years after age 40 if you are   at a normal weight and have a low risk for diabetes.  More often and at a younger age if you are overweight or have a high risk for diabetes. What should I know about preventing infection? Hepatitis B If you have a higher risk for hepatitis B, you should be screened for this virus. Talk with your health care provider to find out if you are at risk for hepatitis B infection. Hepatitis C Testing is  recommended for:  Everyone born from 1945 through 1965.  Anyone with known risk factors for hepatitis C. Sexually transmitted infections (STIs)  Get screened for STIs, including gonorrhea and chlamydia, if: ? You are sexually active and are younger than 44 years of age. ? You are older than 44 years of age and your health care provider tells you that you are at risk for this type of infection. ? Your sexual activity has changed since you were last screened, and you are at increased risk for chlamydia or gonorrhea. Ask your health care provider if you are at risk.  Ask your health care provider about whether you are at high risk for HIV. Your health care provider may recommend a prescription medicine to help prevent HIV infection. If you choose to take medicine to prevent HIV, you should first get tested for HIV. You should then be tested every 3 months for as long as you are taking the medicine. Pregnancy  If you are about to stop having your period (premenopausal) and you may become pregnant, seek counseling before you get pregnant.  Take 400 to 800 micrograms (mcg) of folic acid every day if you become pregnant.  Ask for birth control (contraception) if you want to prevent pregnancy. Osteoporosis and menopause Osteoporosis is a disease in which the bones lose minerals and strength with aging. This can result in bone fractures. If you are 65 years old or older, or if you are at risk for osteoporosis and fractures, ask your health care provider if you should:  Be screened for bone loss.  Take a calcium or vitamin D supplement to lower your risk of fractures.  Be given hormone replacement therapy (HRT) to treat symptoms of menopause. Follow these instructions at home: Lifestyle  Do not use any products that contain nicotine or tobacco, such as cigarettes, e-cigarettes, and chewing tobacco. If you need help quitting, ask your health care provider.  Do not use street drugs.  Do not  share needles.  Ask your health care provider for help if you need support or information about quitting drugs. Alcohol use  Do not drink alcohol if: ? Your health care provider tells you not to drink. ? You are pregnant, may be pregnant, or are planning to become pregnant.  If you drink alcohol: ? Limit how much you use to 0-1 drink a day. ? Limit intake if you are breastfeeding.  Be aware of how much alcohol is in your drink. In the [place] one drink equals one 12 oz bottle of beer (355 mL), one 5 oz glass of wine (148 mL), or one 1 oz glass of hard liquor (44 mL). General instructions  Schedule regular health, dental, and eye exams.  Stay current with your vaccines.  Tell your health care provider if: ? You often feel depressed. ? You have ever been abused or do not feel safe at home. Summary  Adopting a healthy lifestyle and getting preventive care are important in promoting health and wellness.  Follow your health care provider's instructions about   healthy diet, exercising, and getting tested or screened for diseases.  Follow your health care provider's instructions on monitoring your cholesterol and blood pressure. This information is not intended to replace advice given to you by your health care provider. Make sure you discuss any questions you have with your health care provider. Document Released: 05/30/2011 Document Revised: 11/07/2018 Document Reviewed: 11/07/2018 Elsevier Patient Education  2020 Elsevier Inc.  

## 2019-07-04 NOTE — Addendum Note (Signed)
Addended by: Judie Petit on: 07/04/2019 02:35 PM   Modules accepted: Orders

## 2019-07-04 NOTE — Progress Notes (Signed)
[facility] DOB: 43 y.o. MRN: [id number]  Subjective:  Here for Biometric Screen/brief exam Patient is a 43 year old female in no acute distress who comes to the clinic for a brief biometric exam and biometric screening.   She is seeing a nutritionist for her weight with [facility] and reports she has been able to lose 19 lbs since she started this weight loss in March. She sees her primary care provider regularly she reports.  She is walking currently 2 days a week.   Patient  denies any fever, body aches,chills, rash, chest pain, shortness of breath, nausea, vomiting, or diarrhea.    Objective:    Body mass index is 49.91 kg/m.    Blood pressure 118/86, pulse 96, temperature 97.8 F (36.6 C). NAD, obese, well developed, well nourished  HEENT: Within normal limits Neck: Normal, supple, no lymphadenopathy  Heart: Regular rate and rhythm Lungs: Clear no adventitious lung sounds   Assessment: Biometric screen  1. Encounter for biometric screening   2. Encounter for other general examination - brief physical exam with biometric screening not full annual exam       Plan:  Follow up with primary care as needed for chronic and maintenance health care- can be seen in this employee clinic for acute care.   Fasting glucose and lipids. Discussed with patient that today's visit here is a limited biometric screening visit (not a comprehensive exam or management of any chronic problems) Discussed some health issues, including healthy eating habits and exercise. Encouraged to follow-up with PCP for annual comprehensive preventive and wellness care (and if applicable, any chronic issues). Questions invited and answered.  The Biometric exam is a brief physical with labs including glucose and cholesterol. This does not replace a full physical with a primary care provider, and additional recommended labs. This is an acute care  clinic not for maintenance of chronic or long standing conditions.   Provider also recommends if you do not have a primary care provider for patient to establish care promptly.You can choose any provider of your choice at any facility of your choice, the below information is  just a resource to aid in you finding a primary care provider for routine health maintenance.   [facility]  PHYSICIAN/PROVIDER  REFERRAL LINE at [phone]  [email or web address] to help assist with finding a primary care doctor.   Helpful resources below of other primary care office's accepting new patients.   [facility]         [address]  [place]. [place] [address] [phone]  . [facility]    [address] [place], [place] [address] [phone]  . [facility] [address] [place], [place]  [phone]   . [facility]  [address] [place] [address] [phone]    Follow up with primary care as needed for chronic and maintenance health care- can be seen in this employee clinic for acute care.

## 2019-07-05 LAB — GLUCOSE, RANDOM: Glucose: 103 mg/dL — ABNORMAL HIGH (ref 65–99)

## 2019-07-05 LAB — LIPID PANEL WITH LDL/HDL RATIO
Cholesterol, Total: 150 mg/dL (ref 100–199)
HDL: 46 mg/dL (ref >39)
LDL Calculated: 81 mg/dL (ref 0–99)
LDl/HDL Ratio: 1.8 ratio (ref 0.0–3.2)
Triglycerides: 113 mg/dL (ref 0–149)
VLDL Cholesterol Cal: 23 mg/dL (ref 5–40)

## 2019-07-05 NOTE — Progress Notes (Signed)
Patient was called 07/05/19 with results at 815am. Normal cholesterol.Glucose is mildly elevated 103- she is currently under the supervision of a dietician for weight loss. Continue this with diet recommendations and follow up with Myrlene Broker, MD primary care in 3 months or sooner if needed. Patient verbalized understanding of all instructions given and denies any further questions at this time.

## 2020-07-17 ENCOUNTER — Other Ambulatory Visit: Payer: Self-pay

## 2020-07-17 ENCOUNTER — Other Ambulatory Visit: Payer: Managed Care, Other (non HMO)

## 2020-07-17 DIAGNOSIS — Z20822 Contact with and (suspected) exposure to covid-19: Secondary | ICD-10-CM

## 2020-07-19 LAB — SARS-COV-2, NAA 2 DAY TAT

## 2020-07-19 LAB — NOVEL CORONAVIRUS, NAA: SARS-CoV-2, NAA: NOT DETECTED

## 2020-08-27 ENCOUNTER — Ambulatory Visit: Payer: Managed Care, Other (non HMO) | Admitting: Physician Assistant

## 2020-08-27 ENCOUNTER — Other Ambulatory Visit: Payer: Self-pay

## 2020-08-27 ENCOUNTER — Encounter: Payer: Self-pay | Admitting: Physician Assistant

## 2020-08-27 VITALS — BP 140/80 | HR 92 | Temp 98.0°F | Resp 18 | Ht 68.0 in | Wt 340.0 lb

## 2020-08-27 DIAGNOSIS — Z008 Encounter for other general examination: Secondary | ICD-10-CM

## 2020-08-27 DIAGNOSIS — Z0000 Encounter for general adult medical examination without abnormal findings: Secondary | ICD-10-CM

## 2020-08-28 LAB — LIPID PANEL
Chol/HDL Ratio: 3.3 ratio (ref 0.0–4.4)
Cholesterol, Total: 161 mg/dL (ref 100–199)
HDL: 49 mg/dL (ref >39)
LDL Chol Calc (NIH): 91 mg/dL (ref 0–99)
Triglycerides: 116 mg/dL (ref 0–149)
VLDL Cholesterol Cal: 21 mg/dL (ref 5–40)

## 2020-08-28 LAB — GLUCOSE, RANDOM: Glucose: 81 mg/dL (ref 65–99)

## 2020-08-29 ENCOUNTER — Encounter: Payer: Self-pay | Admitting: Physician Assistant

## 2020-08-29 NOTE — Progress Notes (Signed)
Subjective:    Patient ID: Regina Whitaker, female    DOB: [birth year], 44 y.o.   MRN: [id number]  HPI  24 yo F [place] [employer] [place] [employer] , presents for [employer] and brief exam  Has had Covid vacc 1 &2  Will do flu when available Does DDS q 6 mos  PCP Dr Buddy Duty at [facility]  Has and is working with a Weight Management clinic- she reports keeping in close contact. Has had counseling and classes for interventions when eating seems like the only thing to do-- Issue has been long standing. Waxes and wanes with adhering to coping/management skills Has used Ozempic for borderline A1C and for anticipated help with weight loss Lorazepam  0.5mg  q 8 h prn stress  No routine exercise program BMI 51   154 KG  340 pounds   5'8" "Prediabetes" GERD /IBS Herniated disc L4, L5-S1  Review of Systems Seasonal allergies-  Allertec Albuterol for exertional asthma Anxiety    Objective:   Physical Exam Vitals and nursing note reviewed.  Constitutional:      General: She is not in acute distress.    Comments: morbidly obese. Well kempt,   HENT:     Head: Normocephalic and atraumatic.     Right Ear: Tympanic membrane, ear canal and external ear normal.     Left Ear: Tympanic membrane, ear canal and external ear normal.     Ears:     Comments: Q-tips-reviewed pro/con    Nose: Nose normal.     Mouth/Throat:     Mouth: Mucous membranes are moist.     Comments: DDS q 6 mos   dentition in good repair Tonsillar pits-- reports hx of stones, still occasionally occur Eyes:     Extraocular Movements: Extraocular movements intact.     Conjunctiva/sclera: Conjunctivae normal.  Cardiovascular:     Rate and Rhythm: Normal rate and regular rhythm.     Pulses: Normal pulses.     Heart sounds: Normal heart sounds.  Pulmonary:     Effort: Pulmonary effort is normal.  Abdominal:     General: Bowel sounds are normal.     Comments: Adiposity limits exam, no tenderness or mass  identified  Musculoskeletal:        General: Normal range of motion.     Cervical back: Normal range of motion and neck supple.  Skin:    General: Skin is warm and dry.     Capillary Refill: Capillary refill takes less than 2 seconds.  Neurological:     General: No focal deficit present.     Mental Status: She is alert.     Cranial Nerves: No cranial nerve deficit.     Deep Tendon Reflexes: Reflexes normal.  Psychiatric:        Mood and Affect: Mood normal.        Behavior: Behavior normal.       Assessment & Plan:  Discuss possible interventions-- Try to stay positive ~ day at a time Consider the need for increased awareness of  calories in /calories burned -- so she can handle each day at a time. Every thing we eat does make a difference Goal is to walk 30 minutes uninterrupted with slightly elevated heart rate and resp rate. Start with 10 repeated 3x per day- transition  2x 15 min as able. Increase effort as able. Level surface, supportive shoes, good posture  Gives her handle on catching up with each day as reachable mini-goals  Stay in contact with all established consultants and communicate  success and slips. Get nutritional report from all restaurants she frequents- And study them -make good choices. Healthy food choices, portion control, no seconds or desserts Increased water to chase away real or imagined hunger-warm drinks often helpful 1/2 hour before meals  Will report labs as available

## 2023-02-14 ENCOUNTER — Ambulatory Visit
Admission: RE | Admit: 2023-02-14 | Discharge: 2023-02-14 | Disposition: A | Discharge status: Home / Self Care | Payer: Managed Care, Other (non HMO) | Source: Ambulatory Visit

## 2023-02-14 VITALS — BP 123/77 | HR 104 | Temp 97.9°F | Resp 18

## 2023-02-14 DIAGNOSIS — J302 Other seasonal allergic rhinitis: Secondary | ICD-10-CM

## 2023-02-14 DIAGNOSIS — J4521 Mild intermittent asthma with (acute) exacerbation: Secondary | ICD-10-CM

## 2023-02-14 DIAGNOSIS — J069 Acute upper respiratory infection, unspecified: Secondary | ICD-10-CM

## 2023-02-14 MED ORDER — PREDNISONE 10 MG PO TABS: 40.0000 mg | ORAL_TABLET | Freq: Every day | ORAL | 0 refills | Status: AC

## 2023-02-14 NOTE — ED Provider Notes (Signed)
[facility]    CSN: [id number] Arrival date & time: 02/14/23  1018      History   Chief Complaint Chief Complaint  Patient presents with   Cough    I have had a temperature, coughing and congestion. - Entered by patient    HPI Regina Whitaker is a 47 y.o. female.  Patient presents with 3 day history of fever, chills, headache, congestion, postnasal drip, sore throat, cough, wheezing, shortness of breath.  Tmax 100.5.  Treating symptoms with OTC cold medication and her albuterol inhaler.  She denies rash, ear pain, chest pain, vomiting, diarrhea, or other symptoms.  Patient was seen at [facility] clinic on 01/11/2023; diagnosed with sinusitis; treated with Augmentin.  Her medical history includes asthma, GERD, IBS, anxiety, depression.    The history is provided by the patient and medical records.    Past Medical History:  Diagnosis Date   Anxiety    Bleeding nose    Calf pain    when walking   Cough    Depression    Frequent headaches    GERD (gastroesophageal reflux disease)    IBS (irritable bowel syndrome)    Nerve pain    Sinus problem     Patient Active Problem List   Diagnosis Date Noted   Daytime somnolence 05/20/2019   Prediabetes 04/26/2019   Chronic anxiety 12/12/2018   GERD without esophagitis 12/12/2018   Mild intermittent asthma without complication [id number]    Past Surgical History:  Procedure Laterality Date   arm surgery     right   CESAREAN SECTION     MOUTH SURGERY      OB History   No obstetric history on file.      Home Medications    Prior to Admission medications   Medication Sig Start Date End Date Taking? Authorizing Provider  OZEMPIC, 0.25 OR 0.5 MG/DOSE, 2 MG/3ML SOPN INJECT 0.25MG  INTO THE SKIN ONE TIME PER WEEK 01/11/23  Yes [provider]  predniSONE (DELTASONE) 10 MG tablet Take 4 tablets (40 mg total) by mouth daily for 5 days. 02/14/23 02/19/23 Yes Sharion Balloon, NP  TRINTELLIX 20 MG TABS tablet Take 1  tablet by mouth daily. 12/13/22  Yes [provider]  albuterol (VENTOLIN HFA) 108 (90 Base) MCG/ACT inhaler Inhale into the lungs. 12/12/18   [provider]  Ascorbic Acid (VITAMIN C) POWD Take by mouth.    [provider]  cyclobenzaprine (FLEXERIL) 5 MG tablet Take 1 tablet (5 mg total) by mouth 3 (three) times daily as needed for muscle spasms. 10/14/16   Fisher, Linden Dolin, PA-C  diphenhydrAMINE (BENADRYL) 25 MG tablet Take by mouth. Patient not taking: Reported on 08/27/2020    [provider]  DULoxetine (CYMBALTA) 60 MG capsule  04/09/17   [provider]  gabapentin (NEURONTIN) 300 MG capsule  04/08/17   [provider]  Insulin Pen Needle (B-D ULTRAFINE III SHORT PEN) 31G X 8 MM MISC 1 APPLICATION BY MISC.(NON-DRUG COMBO ROUTE) ROUTE DAILY AT 6PM. USE WITH VICTOZA Patient not taking: Reported on 08/27/2020 06/24/19   [provider]  liraglutide (VICTOZA) 18 MG/3ML SOPN Inject into the skin. 06/24/19 09/22/19  [provider]  LORazepam (ATIVAN) 0.5 MG tablet Take 0.5 mg by mouth every 8 (eight) hours. Reported on 06/14/2016    [provider]  Multiple Vitamin (MULTIVITAMIN) capsule Take by mouth.    [provider]  omeprazole (PRILOSEC) 20 MG capsule Take 20 mg  by mouth daily.    [provider]  tiZANidine (ZANAFLEX) 4 MG tablet  05/29/19   [provider]  topiramate (TOPAMAX) 25 MG tablet  05/14/19   [provider]    Family History History reviewed. No pertinent family history.  Social History Social History   Tobacco Use   Smoking status: Never   Smokeless tobacco: Never  Substance Use Topics   Alcohol use: No   Drug use: No     Allergies   Mobic [meloxicam] and Naprosyn [naproxen]   Review of Systems Review of Systems  Constitutional:  Positive for fever. Negative for chills.  HENT:  Positive for congestion, postnasal drip, rhinorrhea and sore throat.  Negative for ear pain.   Respiratory:  Positive for cough, shortness of breath and wheezing.   Cardiovascular:  Negative for chest pain and palpitations.  Gastrointestinal:  Negative for abdominal pain, diarrhea and vomiting.  Skin:  Negative for color change and rash.  All other systems reviewed and are negative.    Physical Exam Triage Vital Signs ED Triage Vitals  Enc Vitals Group     BP 02/14/23 1114 123/77     Pulse Rate 02/14/23 1106 (!) 104     Resp 02/14/23 1106 18     Temp 02/14/23 1106 97.9 F (36.6 C)     Temp src --      SpO2 02/14/23 1106 97 %     Weight --      Height --      Head Circumference --      Peak Flow --      Pain Score 02/14/23 1112 4     Pain Loc --      Pain Edu? --      Excl. in [place]? --    No data found.  Updated Vital Signs BP 123/77   Pulse (!) 104   Temp 97.9 F (36.6 C)   Resp 18   SpO2 97%   Visual Acuity Right Eye Distance:   Left Eye Distance:   Bilateral Distance:    Right Eye Near:   Left Eye Near:    Bilateral Near:     Physical Exam Vitals and nursing note reviewed.  Constitutional:      General: She is not in acute distress.    Appearance: She is well-developed. She is obese. She is not ill-appearing.  HENT:     Right Ear: Tympanic membrane normal.     Left Ear: Tympanic membrane normal.     Nose: Congestion and rhinorrhea present.     Mouth/Throat:     Mouth: Mucous membranes are moist.     Pharynx: Oropharynx is clear.     Comments: Clear PND. Cardiovascular:     Rate and Rhythm: Normal rate and regular rhythm.     Heart sounds: Normal heart sounds.  Pulmonary:     Effort: Pulmonary effort is normal. No respiratory distress.     Breath sounds: Normal breath sounds. No wheezing.  Musculoskeletal:     Cervical back: Neck supple.  Skin:    General: Skin is warm and dry.  Neurological:     Mental Status: She is alert.  Psychiatric:        Mood and Affect: Mood normal.        Behavior: Behavior normal.       UC Treatments / Results  Labs (all labs ordered are listed, but only abnormal results are displayed) Labs Reviewed - No data to display  EKG   Radiology No results found.  Procedures Procedures (including critical care time)  Medications Ordered in UC Medications - No data to display  Initial Impression / Assessment and Plan / UC Course  I have reviewed the triage vital signs and the nursing notes.  Pertinent labs & imaging results that were available during my care of the patient were reviewed by me and considered in my medical decision making (see chart for details).    Asthma exacerbation, viral URI, seasonal allergies.  No respiratory distress, lungs are clear at this time, O2 sat 97% on room air.  Treating today with prednisone and continued use of albuterol inhaler.  Education provided on asthma, allergic rhinitis, and viral URI.  Instructed patient to follow up with her PCP if her symptoms are not improving.  She agrees to plan of care.    Final Clinical Impressions(s) / UC Diagnoses   Final diagnoses:  Mild intermittent asthma with acute exacerbation  Seasonal allergies  Viral URI     Discharge Instructions      Take the prednisone as directed.  Continue using your albuterol inhaler.  Follow up with your primary care provider if your symptoms are not improving.        ED Prescriptions     Medication Sig Dispense Auth. Provider   predniSONE (DELTASONE) 10 MG tablet Take 4 tablets (40 mg total) by mouth daily for 5 days. 20 tablet Sharion Balloon, NP      PDMP not reviewed this encounter.   Sharion Balloon, NP 02/14/23 [phone]

## 2023-02-14 NOTE — ED Triage Notes (Signed)
Patient to Urgent Care with complaints of cough/ congestion/ fevers/ chills/ sweats/ headaches. Symptoms started Saturday. Sunday started having fevers.   Reports fevers last night- max temp 100.5. Taking over the counter cold and flu medications. Reports having a sinus infection over a month ago and is also taking promethazine-DM from previous prescription.

## 2023-02-14 NOTE — Discharge Instructions (Addendum)
Take the prednisone as directed.  Continue using your albuterol inhaler.  Follow up with your primary care provider if your symptoms are not improving.

## 2023-09-05 ENCOUNTER — Encounter: Payer: Self-pay | Admitting: Neurology

## 2023-09-05 ENCOUNTER — Ambulatory Visit: Payer: Managed Care, Other (non HMO) | Admitting: Neurology

## 2023-09-05 ENCOUNTER — Telehealth: Payer: Self-pay | Admitting: Neurology

## 2023-09-05 VITALS — BP 136/88 | HR 95 | Ht 68.25 in | Wt 349.5 lb

## 2023-09-05 DIAGNOSIS — G43709 Chronic migraine without aura, not intractable, without status migrainosus: Secondary | ICD-10-CM | POA: Diagnosis not present

## 2023-09-05 DIAGNOSIS — F959 Tic disorder, unspecified: Secondary | ICD-10-CM | POA: Insufficient documentation

## 2023-09-05 MED ORDER — SUMATRIPTAN SUCCINATE 50 MG PO TABS: ORAL_TABLET | ORAL | 11 refills | Status: AC

## 2023-09-05 NOTE — Telephone Encounter (Signed)
sent to GI due to her weight, they obtain [place] [facility]. [phone]
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
## 2023-09-18 ENCOUNTER — Other Ambulatory Visit: Payer: Managed Care, Other (non HMO)

## 2023-10-11 ENCOUNTER — Ambulatory Visit (INDEPENDENT_AMBULATORY_CARE_PROVIDER_SITE_OTHER): Payer: Managed Care, Other (non HMO) | Admitting: Neurology

## 2023-10-11 ENCOUNTER — Ambulatory Visit
Admission: RE | Admit: 2023-10-11 | Discharge: 2023-10-11 | Disposition: A | Discharge status: Home / Self Care | Payer: Managed Care, Other (non HMO) | Source: Ambulatory Visit | Attending: Neurology | Admitting: Neurology

## 2023-10-11 DIAGNOSIS — G43709 Chronic migraine without aura, not intractable, without status migrainosus: Secondary | ICD-10-CM

## 2023-10-11 DIAGNOSIS — F959 Tic disorder, unspecified: Secondary | ICD-10-CM

## 2023-10-11 DIAGNOSIS — R4182 Altered mental status, unspecified: Secondary | ICD-10-CM | POA: Diagnosis not present

## 2023-10-12 DIAGNOSIS — G43709 Chronic migraine without aura, not intractable, without status migrainosus: Secondary | ICD-10-CM | POA: Diagnosis not present

## 2023-10-12 DIAGNOSIS — F959 Tic disorder, unspecified: Secondary | ICD-10-CM | POA: Diagnosis not present

## 2023-10-13 ENCOUNTER — Encounter (INDEPENDENT_AMBULATORY_CARE_PROVIDER_SITE_OTHER): Payer: Managed Care, Other (non HMO) | Admitting: Neurology

## 2023-10-13 DIAGNOSIS — G43709 Chronic migraine without aura, not intractable, without status migrainosus: Secondary | ICD-10-CM | POA: Diagnosis not present

## 2023-10-16 NOTE — Telephone Encounter (Signed)
Please see the MyChart message reply(ies) for my assessment and plan.    This patient gave consent for this Medical Advice Message and is aware that it may result in a bill to [employer], as well as the possibility of receiving a bill for a co-payment or deductible. They are an established patient, but are not seeking medical advice exclusively about a problem treated during an in person or video visit in the last seven days. I did not recommend an in person or video visit within seven days of my reply.    I spent a total of 10 minutes cumulative time within 7 days through [employer].  Marcial Pacas, MD

## 2023-10-23 NOTE — Telephone Encounter (Signed)
Please call and inform patient that her EEG was normal. No abnormalities seen during the recording.

## 2023-10-23 NOTE — Procedures (Signed)
   History:  47 year old woman with events concerning for seizure   EEG classification:  Awake and asleep  Duration: 27 minutes   Technical aspects: This EEG study was done with scalp electrodes positioned according to the 10-20 International system of electrode placement. Electrical activity was reviewed with band pass filter of 1-70Hz , sensitivity of 7 uV/mm, display speed of 80mm/sec with a 60Hz  notched filter applied as appropriate. EEG data were recorded continuously and digitally stored.   Description of the recording: The background rhythms of this recording consists of a fairly well modulated medium amplitude background activity of 10 Hz. As the record progresses, the patient initially is in the waking state, but appears to enter the early stage II sleep during the recording, with rudimentary sleep spindles and vertex sharp wave activity seen. During the wakeful state, photic stimulation was performed, and no abnormal responses were seen. Hyperventilation was also performed, no abnormal response seen. No epileptiform discharges seen during this recording. There was no focal slowing.   Abnormality: None   Impression: This is a normal EEG recording in the waking and sleeping state. No evidence of interictal epileptiform discharges. Normal EEGs, however, do not rule out epilepsy.    Windell Norfolk, MD [facility]

## 2023-10-24 ENCOUNTER — Encounter: Payer: Self-pay | Admitting: Neurology

## 2024-02-10 ENCOUNTER — Ambulatory Visit
Admission: EM | Admit: 2024-02-10 | Discharge: 2024-02-10 | Disposition: A | Discharge status: Home / Self Care | Attending: Emergency Medicine | Admitting: Emergency Medicine

## 2024-02-10 DIAGNOSIS — R300 Dysuria: Secondary | ICD-10-CM

## 2024-02-10 DIAGNOSIS — N3000 Acute cystitis without hematuria: Secondary | ICD-10-CM | POA: Diagnosis not present

## 2024-02-10 LAB — POCT URINALYSIS DIP (MANUAL ENTRY)
Bilirubin, UA: NEGATIVE
Blood, UA: NEGATIVE — AB
Glucose, UA: 100 mg/dL — AB
Ketones, POC UA: NEGATIVE mg/dL
Leukocytes, UA: NEGATIVE
Nitrite, UA: POSITIVE — AB
Protein Ur, POC: 30 mg/dL — AB
Spec Grav, UA: 1.01 (ref 1.010–1.025)
Urobilinogen, UA: 2 U/dL — AB
pH, UA: 5 (ref 5.0–8.0)

## 2024-02-10 MED ORDER — NITROFURANTOIN MONOHYD MACRO 100 MG PO CAPS: 100.0000 mg | ORAL_CAPSULE | Freq: Two times a day (BID) | ORAL | 0 refills | Status: AC

## 2024-02-10 NOTE — ED Provider Notes (Signed)
 Regina Whitaker    CSN: [id number] Arrival date & time: 02/10/24  1437      History   Chief Complaint Chief Complaint  Patient presents with   Dysuria    HPI Regina Whitaker is a 48 y.o. female.   Patient presents for evaluation of urinary frequency, dysuria and lower abdominal pain beginning 1 day ago.  Has attempted use of Azo, helpful.  Denies fever, hematuria, vaginal symptoms.  Past Medical History:  Diagnosis Date   Anxiety    Bleeding nose    Calf pain    when walking   Cough    Depression    Frequent headaches    GERD (gastroesophageal reflux disease)    IBS (irritable bowel syndrome)    Nerve pain    Sinus problem     Patient Active Problem List   Diagnosis Date Noted   Chronic migraine w/o aura w/o status migrainosus, not intractable 09/05/2023   Tic 09/05/2023   Daytime somnolence 05/20/2019   Prediabetes 04/26/2019   Chronic anxiety 12/12/2018   GERD without esophagitis 12/12/2018   Mild intermittent asthma without complication 12/12/2018    Past Surgical History:  Procedure Laterality Date   arm surgery     right   CESAREAN SECTION     MOUTH SURGERY      OB History   No obstetric history on file.      Home Medications    Prior to Admission medications   Medication Sig Start Date End Date Taking? Authorizing Provider  nitrofurantoin, macrocrystal-monohydrate, (MACROBID) 100 MG capsule Take 1 capsule (100 mg total) by mouth 2 (two) times daily. 02/10/24  Yes Averie Meiner R, NP  albuterol (VENTOLIN HFA) 108 (90 Base) MCG/ACT inhaler Inhale into the lungs. 12/12/18   [provider]  Ascorbic Acid (VITAMIN C) POWD Take by mouth.    [provider]  buPROPion (WELLBUTRIN XL) 300 MG 24 hr tablet Take 300 mg by mouth daily. 09/04/23   [provider]  busPIRone (BUSPAR) 30 MG tablet Take 30 mg by mouth 2 (two) times daily. 08/08/23   [provider]  cyclobenzaprine (FLEXERIL) 5 MG tablet Take 1 tablet  (5 mg total) by mouth 3 (three) times daily as needed for muscle spasms. 10/14/16   Faythe Ghee, PA-C  Multiple Vitamin (MULTIVITAMIN) capsule Take by mouth.    [provider]  omeprazole (PRILOSEC) 20 MG capsule Take 20 mg by mouth daily.    [provider]  SUMAtriptan (IMITREX) 50 MG tablet May repeat in 2 hours if headache persists or recurs. 09/05/23   Levert Feinstein, MD  tiZANidine (ZANAFLEX) 4 MG tablet  05/29/19   [provider]  TRINTELLIX 20 MG TABS tablet Take 1 tablet by mouth daily. 12/13/22   [provider]    Family History Family History  Problem Relation Age of Onset   Migraines Mother    Stroke Paternal Grandmother     Social History Social History   Tobacco Use   Smoking status: Never   Smokeless tobacco: Never  Vaping Use   Vaping status: Never Used  Substance Use Topics   Alcohol use: No   Drug use: No     Allergies   Meloxicam and Naproxen   Review of Systems Review of Systems   Physical Exam Triage Vital Signs ED Triage Vitals [02/10/24 1456]  Encounter Vitals Group     BP      Systolic BP Percentile  Diastolic BP Percentile      Pulse      Resp      Temp      Temp src      SpO2      Weight      Height      Head Circumference      Peak Flow      Pain Score 5     Pain Loc      Pain Education      Exclude from Growth Chart    No data found.  Updated Vital Signs LMP  (LMP Unknown) Comment: LMP about 3 months ago.  Visual Acuity Right Eye Distance:   Left Eye Distance:   Bilateral Distance:    Right Eye Near:   Left Eye Near:    Bilateral Near:     Physical Exam Constitutional:      Appearance: Normal appearance.  Eyes:     Extraocular Movements: Extraocular movements intact.  Pulmonary:     Effort: Pulmonary effort is normal.  Abdominal:     Tenderness: There is abdominal tenderness in the suprapubic area. There is no right CVA tenderness or left CVA tenderness.  Neurological:      Mental Status: She is oriented to person, place, and time. Mental status is at baseline.      UC Treatments / Results  Labs (all labs ordered are listed, but only abnormal results are displayed) Labs Reviewed  POCT URINALYSIS DIP (MANUAL ENTRY) - Abnormal; Notable for the following components:      Result Value   Color, UA orange (*)    Glucose, UA =100 (*)    Blood, UA negative (*)    Protein Ur, POC =30 (*)    Urobilinogen, UA 2.0 (*)    Nitrite, UA Positive (*)    All other components within normal limits  URINE CULTURE    EKG   Radiology No results found.  Procedures Procedures (including critical care time)  Medications Ordered in UC Medications - No data to display  Initial Impression / Assessment and Plan / UC Course  I have reviewed the triage vital signs and the nursing notes.  Pertinent labs & imaging results that were available during my care of the patient were reviewed by me and considered in my medical decision making (see chart for details).  Acute cystitis without hematuria, dysuria  Urinalysis showing nitrates, sent for culture, prescribed Macrobid and discussed additional supportive measures with follow-up as needed Final Clinical Impressions(s) / UC Diagnoses   Final diagnoses:  Dysuria  Acute cystitis without hematuria     Discharge Instructions      Your urinalysis shows Regina Whitaker blood cells and nitrates which are indicative of infection, your urine will be sent to the lab to determine exactly which bacteria is present, if any changes need to be made to your medications you will be notified  Begin use of Macrobid every morning and every evening for 5 days  You may use over-the-counter Azo to help minimize your symptoms until antibiotic removes bacteria, this medication will turn your urine orange  Increase your fluid intake through use of water  As always practice good hygiene, wiping front to back and avoidance of scented vaginal  products to prevent further irritation  If symptoms continue to persist after use of medication or recur please follow-up with urgent care or your primary doctor as needed    ED Prescriptions     Medication Sig Dispense Auth. Provider  nitrofurantoin, macrocrystal-monohydrate, (MACROBID) 100 MG capsule Take 1 capsule (100 mg total) by mouth 2 (two) times daily. 10 capsule Valinda Hoar, NP      PDMP not reviewed this encounter.   Valinda Hoar, [place] 02/10/24 [phone]

## 2024-02-10 NOTE — ED Triage Notes (Signed)
 Patient presents to [facility] for lower abdominal pain and dysuria since yesterday. Nausea since today. Took AZO.

## 2024-02-10 NOTE — Discharge Instructions (Addendum)
 Your urinalysis shows Regina Whitaker blood cells and nitrates which are indicative of infection, your urine will be sent to the lab to determine exactly which bacteria is present, if any changes need to be made to your medications you will be notified  Begin use of Macrobid every morning and every evening for 5 days  You may use over-the-counter Azo to help minimize your symptoms until antibiotic removes bacteria, this medication will turn your urine orange  Increase your fluid intake through use of water  As always practice good hygiene, wiping front to back and avoidance of scented vaginal products to prevent further irritation  If symptoms continue to persist after use of medication or recur please follow-up with urgent care or your primary doctor as needed

## 2024-05-21 ENCOUNTER — Ambulatory Visit: Attending: Family Medicine

## 2024-05-21 DIAGNOSIS — R262 Difficulty in walking, not elsewhere classified: Secondary | ICD-10-CM | POA: Insufficient documentation

## 2024-05-21 DIAGNOSIS — M5459 Other low back pain: Secondary | ICD-10-CM | POA: Insufficient documentation

## 2024-05-21 NOTE — Therapy (Unsigned)
 OUTPATIENT PHYSICAL THERAPY EVALUATION  Patient Name: Regina Whitaker MRN: [id number] DOB:[birth year], 48 y.o., female Today's Date: 05/21/2024  END OF SESSION:  PT End of Session - 05/21/24 0915     Visit Number 1    Number of Visits 12    Date for PT Re-Evaluation 07/02/24    Authorization Type [employer] Managed    Authorization Time Period 05/21/24-07/02/24    Progress Note Due on Visit 10    PT Start Time 0904    PT Stop Time 0940    PT Time Calculation (min) 36 min    Activity Tolerance Patient tolerated treatment well;No increased pain;Patient limited by fatigue    Behavior During Therapy [facility] for tasks assessed/performed          Past Medical History:  Diagnosis Date   Anxiety    Bleeding nose    Calf pain    when walking   Cough    Depression    Frequent headaches    GERD (gastroesophageal reflux disease)    IBS (irritable bowel syndrome)    Nerve pain    Sinus problem    Past Surgical History:  Procedure Laterality Date   arm surgery     right   CESAREAN SECTION     MOUTH SURGERY     Patient Active Problem List   Diagnosis Date Noted   Chronic migraine w/o aura w/o status migrainosus, not intractable 09/05/2023   Tic 09/05/2023   Daytime somnolence 05/20/2019   Prediabetes 04/26/2019   Chronic anxiety 12/12/2018   GERD without esophagitis 12/12/2018   Mild intermittent asthma without complication 12/12/2018    PCP: Silver Lamar LABOR, MD REFERRING PROVIDER: Con Hamel, MD  REFERRING DIAG: Lumbar radiculopathy  Rationale for Evaluation and Treatment: Rehabilitation  THERAPY DIAG:  Other low back pain  Difficulty in walking, not elsewhere classified  ONSET DATE: Chronic with acute exacerbation x6-8 months  SUBJECTIVE:                                                                                                                                                                                           SUBJECTIVE STATEMENT: Pt reports chronic  low back pain, different episodes over the years, needs help with her current pain, pretty limiting.   PERTINENT HISTORY:  47yoF with acute on chronic low back pain, referred by [name]. Pt has done PT in the past for prior episodes, has ultimately needed epidural injections in the past. Pt has constant central left sided low back pain occasional shooting pain in left lower lateral leg along fibularis distribution. Pt has been limited from strength/conditioning type  exercise for years due to fears of aggravating back spasms, but also has been unable to perform treadmill walking for exercise. 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   PAIN:  Are you having pain?  Yes; 5/10 current, 5/10 best pain in past 7 day average, 6-8/10 worse pain (low back, central lower, left); leg pain symptoms are intermittent and shooting only.   PRECAUTIONS: None RED FLAGS: Pt denies any change in bowel/bladder, denies recent weight loss unexplained in origin, denies any nigh sweats, denies night terrors, denies saddle paresthesias.   WEIGHT BEARING RESTRICTIONS: None  FALLS:  Has patient fallen in last 6 months? Nope  OCCUPATION: Works full time, Health and safety inspector job, Designer, multimedia social services PLOF: prior to this episode, walking for exercise on treadmill; (~8 months prior); unlimited AMB in community.   PATIENT GOALS:  Be able to freely participate in community mobility ad lib without end-of-day back pain Be able to resume self selected fitness activity for the self management of wellness   OBJECTIVE:  Note: Objective measures were completed at Evaluation unless otherwise noted.  DIAGNOSTIC FINDINGS:  Only able to find imaging results from as recent as 2017  IMPRESSION: L3-4: Shallow left posterolateral disc protrusion. Mild  facet hypertrophy. Narrowing of the left lateral recess that could possibly cause neural compression.   L4-5:  Disc bulge.  No compressive stenosis.   L5-S1: Disc bulge. Mild facet hypertrophy more on the left. Narrowing of the subarticular lateral recess on the left that could affect the S1 nerve root.    Electronically Signed   By: [name] M.D.   On: 11/13/2016 10:53  PATIENT SURVEYS:  MODI: 40%   SENSATION: Pt denies any acute or chronic change to sensorium; assessment deferred to referring provider.   LUMBAR ROM:  Lumbar flexion - (standing toe touch) - no effect on symptoms - maintains slight lumbar lordosis Lumbar extension - (standing arch, hands on pelvis) - no effect on symptoms - lordosis to end range > in lower segments than in upper T10-L2  Lumbar Rotation Right - (standing, WBOS, full body rotation) - no effect on symptoms - ~80 degrees rotation at T1 level (WNL)  Lumbar Rotation Left - (standing, WBOS, full body rotation) - no effect on symptoms - ~80 degrees rotation at T1 level (WNL)  LUMBAR SPECIAL TESTS:  Passive Straight Leg Raise: Left  -negative SLR, negative with ankle DF, negative with ankle DF + cervical flexion  FUNCTIONAL TESTS:  -Overground AMB gait assessment, 4x88ft: No device used, gait speed appropriate for distance, setting, no LOB, no frank antalgia   grossly unremarkable, very slight reduction in DF time after heel strike Left, very minimal foot slap Left, cannot attribute to frank weakness issue -Tip toe walking, heel walking (L5/S1 screening): WNl  Pt able to demonstrate both over 82ft without frank asymmetry of motor function ankles  -30 sec chair rise: 10x (aware of, but  not limited by chronic pain in low back, chronic soreness/joint noise in Rt knee (DJD). No change in symptoms.   -Isometric deadbug: holds for 30sec, moderate effort, moderate increase in back discomfort.    Today's Intervention 05/22/24   -Supine, legs  elevated, hips and knees at 90 and 90: (lumbar flexion stretch and spinal decompression) x 3 minutes  Pt denies any subjective stretch in low back, attribute  to >typical hip joint ROM.   Discussed a few ways to perform at home, asking to perform daily x5 minutes. -Education on use of pool for reduced weight exercise, use of buoyancy; pt report history of partaking in pool aerobics. Pt will need to find pool access, otherwise encouraged to begin immediately.    PATIENT EDUCATION:  Education details: Home lumbar flexion stretch; using pool for physical activity until able to resume treadmill walking.  Person educated: Charell  Education method: collaborative learning, deliberate practice, positive reinforcement, explicit instruction, establish rules. Education comprehension: Good  HOME EXERCISE PROGRAM: -Lumbar flexion/decompression stretch in bed or floor x5 minutes, QD; 05/22/24  ASSESSMENT:  CLINICAL IMPRESSION: AC is a 47yoF referred to OPPT by orthopedics s/p 3rd episode of lumbar spine pain with sciatica features. Pt reports constant pain in back with intermittent shooting nerve symptoms knee to foot left, however both report and exam favor no evidence of peripheral nerve hypofunction. Exam and screening inconsistent with radiculopathy, no centralization/peripheralization phenomenon. Back injections in the past serve as useful diagnostic in current case, particularly given familiarity in presentation to pt. Exam also notable for weakness in abdominal flexion, hypomobility of lumbar spine, both combined with current habitus and postpartum status make likely a preference for end range lumbar lordosis in daily posturing- would explain intermittent sciatic features. Patient will benefit from skilled physical therapy intervention to reduce deficits and impairments identified in evaluation, in order to reduce pain, improve quality of life, and maximize activity tolerance for ADL, IADL, and  leisure/fitness. Physical therapy will help pt achieve long and short term goals of care.   OBJECTIVE IMPAIRMENTS: Decreased knowledge of condition, decreased use of DME, decreased mobility, difficulty walking, decreased strength, decreased ROM. ACTIVITY LIMITATIONS: Lifting, standing, walking, squatting, transfers, locomotion level PARTICIPATION LIMITATIONS: Cleaning, laundry, interpersonal relationships, driving, yardwork, community activity.  PERSONAL FACTORS: Age, behavior pattern, education, past/current experiences, transportation, profession  are also affecting patient's functional outcome.  REHAB POTENTIAL: Fair CLINICAL DECISION MAKING: Evolving/moderate complexity EVALUATION COMPLEXITY: Medium  GOALS: Goals reviewed with patient? Yes  SHORT TERM GOALS: Target date: 06/20/24  Pt to report resuming physical activity as desired in a modified capacity that has no negative effect on low back or leg symptoms pain.  Baseline: unable to exercise due to symptoms response  Goal status: INITIAL  2.  Pt to report improved weekly average pain score by >2 points.  Baseline: 5/10  Goal status: INITIAL  LONG TERM GOALS: Target date: 07/21/24  Pt to report increase in ability to perform community AMB for IADL and leisure without significant increase in back pain symptoms.  Baseline: limited compared to baseline.  Goal status: INITIAL  2.  MODI score improvement by >15% to indicate reduced back-related disability.  Baseline: MODI 40%  Goal status: INITIAL  3.  Pt to demonstrate >1419ft without increase in back pain >1/10.  Baseline:  Goal status: INITIAL  4.  Pt to demonstrate isometric deadbug 15x15sec without increased back pain to demonstrate improved abd motor function and improved tolerance to core muscle use.  Baseline: 30sec with moderate effort and moderate pain increase.  Goal status: INITIAL  5.  Pt to report able to return to reduced volume treadmill walking workouts  without >2/10 low back increase same day.  Baseline:  Goal status: INITIAL  PLAN: PT FREQUENCY: 1-2x/week  PT DURATION: 6 weeks PLANNED INTERVENTIONS: 97110-Therapeutic exercises, 97530- Therapeutic activity, [id number]- Neuromuscular re-education, 97535- Self Care, 02859- Manual therapy, G0283- Electrical stimulation (unattended), Patient/Family education, Balance training,  Stair training, Joint mobilization, Spinal mobilization, Cognitive remediation, Cryotherapy, and Moist heat. PLAN FOR NEXT SESSION: complete remaining tests and measures, expand on HEP, FU on access to pool for aerobic exercise.    Santiago Stenzel C, PT 05/21/2024, 9:43 AM  9:06 AM, 05/22/24 Peggye JAYSON Linear, PT, DPT Physical Therapist - [place] [phone] (Office)

## 2024-05-23 ENCOUNTER — Ambulatory Visit

## 2024-05-23 DIAGNOSIS — M5459 Other low back pain: Secondary | ICD-10-CM | POA: Diagnosis not present

## 2024-05-23 DIAGNOSIS — R262 Difficulty in walking, not elsewhere classified: Secondary | ICD-10-CM

## 2024-05-23 NOTE — Therapy (Signed)
 OUTPATIENT PHYSICAL THERAPY EVALUATION  Patient Name: Regina Whitaker MRN: [id number] DOB:10-17-1976, 48 y.o., female Today's Date: 05/23/2024  END OF SESSION:  PT End of Session - 05/23/24 0819     Visit Number 2    Number of Visits 12    Date for PT Re-Evaluation 07/02/24    Authorization Type [employer] Managed    Authorization Time Period 05/21/24-07/02/24    Progress Note Due on Visit 10    PT Start Time [phone]    PT Stop Time 0900    PT Time Calculation (min) 41 min    Activity Tolerance Patient tolerated treatment well;No increased pain;Patient limited by fatigue    Behavior During Therapy [name] for tasks assessed/performed           Past Medical History:  Diagnosis Date   Anxiety    Bleeding nose    Calf pain    when walking   Cough    Depression    Frequent headaches    GERD (gastroesophageal reflux disease)    IBS (irritable bowel syndrome)    Nerve pain    Sinus problem    Past Surgical History:  Procedure Laterality Date   arm surgery     right   CESAREAN SECTION     MOUTH SURGERY     Patient Active Problem List   Diagnosis Date Noted   Chronic migraine w/o aura w/o status migrainosus, not intractable 09/05/2023   Tic 09/05/2023   Daytime somnolence 05/20/2019   Prediabetes 04/26/2019   Chronic anxiety 12/12/2018   GERD without esophagitis 12/12/2018   Mild intermittent asthma without complication 12/12/2018    PCP: Regina Whitaker LABOR, MD REFERRING PROVIDER: Con Hamel, MD  REFERRING DIAG: Lumbar radiculopathy  Rationale for Evaluation and Treatment: Rehabilitation  THERAPY DIAG:  Other low back pain  Difficulty in walking, not elsewhere classified  ONSET DATE: Chronic with acute exacerbation x6-8 months  SUBJECTIVE:                                                                                                                                                                                           SUBJECTIVE STATEMENT: Has been doing her  HEP on the floor with a blanket under her which bothers her low back. 5/10 currently    PERTINENT HISTORY:  47yoF with acute on chronic low back pain, referred [facility]. Pt has done PT in the past for prior episodes, has ultimately needed epidural injections in the past. Pt has constant central left sided low back pain occasional shooting pain in left lower lateral leg along fibularis distribution. Pt has been limited  from strength/conditioning type exercise for years due to fears of aggravating back spasms, but also has been unable to perform treadmill walking for exercise. 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   PAIN: Aggravating factors: sitting on a hard chair for a long period of time (1-2 hours) (soft chair is fine), bending over to do chores, standing for about 10-15 minutes.    Are you having pain?  Yes; 5/10 current, 5/10 best pain in past 7 day average, 6-8/10 worse pain (low back, central lower, left); leg pain symptoms are intermittent and shooting only.   PRECAUTIONS: None RED FLAGS: Pt denies any change in bowel/bladder, denies recent weight loss unexplained in origin, denies any nigh sweats, denies night terrors, denies saddle paresthesias.   WEIGHT BEARING RESTRICTIONS: None  FALLS:  Has patient fallen in last 6 months? Nope  OCCUPATION: Works full time, Health and safety inspector job, Designer, multimedia social services PLOF: prior to this episode, walking for exercise on treadmill; (~8 months prior); unlimited AMB in community.   PATIENT GOALS:  Be able to freely participate in community mobility ad lib without end-of-day back pain Be able to resume self selected fitness activity for the self management of wellness   OBJECTIVE:  Note: Objective measures were completed at Evaluation unless otherwise  noted.  DIAGNOSTIC FINDINGS:  Only able to find imaging results from as recent as 2017  IMPRESSION: L3-4: Shallow left posterolateral disc protrusion. Mild facet hypertrophy. Narrowing of the left lateral recess that could possibly cause neural compression.   L4-5:  Disc bulge.  No compressive stenosis.   L5-S1: Disc bulge. Mild facet hypertrophy more on the left. Narrowing of the subarticular lateral recess on the left that could affect the S1 nerve root.    Electronically Signed   By: [name] M.D.   On: 11/13/2016 10:53  PATIENT SURVEYS:  MODI: 40%   SENSATION: Pt denies any acute or chronic change to sensorium; assessment deferred to referring provider.   LUMBAR ROM:  Lumbar flexion - (standing toe touch) - no effect on symptoms - maintains slight lumbar lordosis Lumbar extension - (standing arch, hands on pelvis) - no effect on symptoms - lordosis to end range > in lower segments than in upper T10-L2  Lumbar Rotation Right - (standing, WBOS, full body rotation) - no effect on symptoms - ~80 degrees rotation at T1 level (WNL)  Lumbar Rotation Left - (standing, WBOS, full body rotation) - no effect on symptoms - ~80 degrees rotation at T1 level (WNL)  LUMBAR SPECIAL TESTS:  Passive Straight Leg Raise: Left  -negative SLR, negative with ankle DF, negative with ankle DF + cervical flexion  FUNCTIONAL TESTS:  -Overground AMB gait assessment, 4x28ft: No device used, gait speed appropriate for distance, setting, no LOB, no frank antalgia   grossly unremarkable, very slight reduction in DF time after heel strike Left, very minimal foot slap Left, cannot attribute to frank weakness issue -Tip toe walking, heel walking (L5/S1 screening): WNl  Pt able to demonstrate both over 54ft without frank asymmetry of motor function ankles  -30 sec chair rise: 10x (aware of, but  not limited by chronic pain in low back, chronic soreness/joint noise in Rt knee (DJD). No change  in symptoms.   -Isometric deadbug: holds for 30sec, moderate effort, moderate increase in back discomfort.  Today's Intervention 05/23/24     Neuromuscular re education  Standing repeated flexion 10x. Decreased low back pain a little   Standing lumbar extension 5x5 seconds    Reclined   Hooklying   Transversus abdominis contraction 10x10 seconds for 2 sets    Posterior pelvic tilt 4x10 seconds    Increased low back band pressure symptoms    Hip extension with leg straight with 1-2 pillows under straight leg    L 10x10 seconds for 2 sets    R 10x10 seconds for 2 sets   Sitting on hard gray chair  Seated glute max squeeze with transversus abdominis contraction 30 seconds x 5    Improved exercise technique, movement at target joints, use of target muscles after mod verbal, visual, tactile cues.     -Education on use of pool for reduced weight exercise, use of buoyancy; pt report history of partaking in pool aerobics. Pt will need to find pool access, otherwise encouraged to begin immediately.    PATIENT EDUCATION:  Education details: Home lumbar flexion stretch; using pool for physical activity until able to resume treadmill walking.  Person educated: [place]  Education method: collaborative learning, deliberate practice, positive reinforcement, explicit instruction, establish rules. Education comprehension: Good  HOME EXERCISE PROGRAM: -Lumbar flexion/decompression stretch in bed or floor x5 minutes, QD; 05/22/24  -Supine, legs elevated, hips and knees at 90 and 90: (lumbar flexion stretch and spinal decompression) x 3 minutes  Pt denies any subjective stretch in low back, attribute to >typical hip joint ROM.   Discussed a few ways to perform at home, asking to perform daily x5 minutes.   Pt was recommended to perform this exercise on her bed instead of the floor. Pt verbalized understanding 05/23/2024    Reclined   Hooklying   Hip extension with leg straight with  1-2 pillows under straight leg    L 10x10 seconds for 3 sets    R 10x10 seconds for 3 sets   Access Code: [email or web address] URL: [email or web address] Date: 05/23/2024 Prepared by: Emil Glassman  Exercises - Seated Transversus Abdominis Bracing  - 8 x daily - 7 x weekly - 1 sets - 5 reps - 30 seconds hold - Seated Gluteal Sets  - 8 x daily - 7 x weekly - 1 sets - 5 reps - 30 seconds hold     ASSESSMENT:  CLINICAL IMPRESSION: Abberant movement observed when returning to the starting position from standing lumbar flexion with increased lumbar hinging observed.  Worked on core and glute strengthening to help decrease stress to low back to help address.  Decreased back pain reported after session. Pt will benefit from continued skilled physical therapy services to decrease pain, improve strength and function.     OBJECTIVE IMPAIRMENTS: Decreased knowledge of condition, decreased use of DME, decreased mobility, difficulty walking, decreased strength, decreased ROM. ACTIVITY LIMITATIONS: Lifting, standing, walking, squatting, transfers, locomotion level PARTICIPATION LIMITATIONS: Cleaning, laundry, interpersonal relationships, driving, yardwork, community activity.  PERSONAL FACTORS: Age, behavior pattern, education, past/current experiences, transportation, profession  are also affecting patient's functional outcome.  REHAB POTENTIAL: Fair CLINICAL DECISION MAKING: Evolving/moderate complexity EVALUATION COMPLEXITY: Medium  GOALS: Goals reviewed with patient? Yes  SHORT TERM GOALS: Target date: 06/20/24  Pt to report resuming physical activity as desired in a modified capacity that has no negative effect on low back or leg symptoms pain.  Baseline: unable to exercise due to symptoms response  Goal status: INITIAL  2.  Pt to report improved weekly average pain score  by >2 points.  Baseline: 5/10  Goal status: INITIAL  LONG TERM GOALS: Target date: 07/21/24  Pt to report  increase in ability to perform community AMB for IADL and leisure without significant increase in back pain symptoms.  Baseline: limited compared to baseline.  Goal status: INITIAL  2.  MODI score improvement by >15% to indicate reduced back-related disability.  Baseline: MODI 40%  Goal status: INITIAL  3.  Pt to demonstrate >1471ft without increase in back pain >1/10.  Baseline:  Goal status: INITIAL  4.  Pt to demonstrate isometric deadbug 15x15sec without increased back pain to demonstrate improved abd motor function and improved tolerance to core muscle use.  Baseline: 30sec with moderate effort and moderate pain increase.  Goal status: INITIAL  5.  Pt to report able to return to reduced volume treadmill walking workouts without >2/10 low back increase same day.  Baseline:  Goal status: INITIAL  PLAN: PT FREQUENCY: 1-2x/week  PT DURATION: 6 weeks PLANNED INTERVENTIONS: 97110-Therapeutic exercises, 97530- Therapeutic activity, 97112- Neuromuscular re-education, 97535- Self Care, 02859- Manual therapy, G0283- Electrical stimulation (unattended), Patient/Family education, Balance training, Stair training, Joint mobilization, Spinal mobilization, Cognitive remediation, Cryotherapy, and Moist heat. PLAN FOR NEXT SESSION: complete remaining tests and measures, expand on HEP, FU on access to pool for aerobic exercise.    Marcellis Frampton, PT, DPT 05/23/2024, 11:11 AM  11:11 AM, 05/23/24  Physical Therapist - [place] [phone] (Office)

## 2024-05-28 ENCOUNTER — Ambulatory Visit: Attending: Family Medicine

## 2024-05-28 DIAGNOSIS — M5459 Other low back pain: Secondary | ICD-10-CM | POA: Diagnosis present

## 2024-05-28 DIAGNOSIS — R262 Difficulty in walking, not elsewhere classified: Secondary | ICD-10-CM | POA: Diagnosis present

## 2024-05-28 NOTE — Therapy (Signed)
 OUTPATIENT PHYSICAL THERAPY TREATMENT  Patient Name: Regina Whitaker MRN: [id number] DOB:02-15-1976, 48 y.o., female Today's Date: 05/28/2024  END OF SESSION:  PT End of Session - 05/28/24 0904     Visit Number 3    Number of Visits 12    Date for PT Re-Evaluation 07/02/24    Authorization Type [employer] Managed    Authorization Time Period 05/21/24-07/02/24    Progress Note Due on Visit 10    PT Start Time 0904    PT Stop Time 0943    PT Time Calculation (min) 39 min    Activity Tolerance Patient tolerated treatment well;No increased pain;Patient limited by fatigue    Behavior During Therapy [facility] for tasks assessed/performed            Past Medical History:  Diagnosis Date   Anxiety    Bleeding nose    Calf pain    when walking   Cough    Depression    Frequent headaches    GERD (gastroesophageal reflux disease)    IBS (irritable bowel syndrome)    Nerve pain    Sinus problem    Past Surgical History:  Procedure Laterality Date   arm surgery     right   CESAREAN SECTION     MOUTH SURGERY     Patient Active Problem List   Diagnosis Date Noted   Chronic migraine w/o aura w/o status migrainosus, not intractable 09/05/2023   Tic 09/05/2023   Daytime somnolence 05/20/2019   Prediabetes 04/26/2019   Chronic anxiety 12/12/2018   GERD without esophagitis 12/12/2018   Mild intermittent asthma without complication 12/12/2018    PCP: Silver Lamar LABOR, MD REFERRING PROVIDER: Con Hamel, MD  REFERRING DIAG: Lumbar radiculopathy  Rationale for Evaluation and Treatment: Rehabilitation  THERAPY DIAG:  Other low back pain  Difficulty in walking, not elsewhere classified  ONSET DATE: Chronic with acute exacerbation x6-8 months  SUBJECTIVE:                                                                                                                                                                                           SUBJECTIVE STATEMENT: Back is about a  7/10 currently. Was a little better (about 3-4/10) after last session until Friday. Pt went to a baseball game Friday night which increased her symptoms (sat on a hard plastic stadium seat).     PERTINENT HISTORY:  47yoF with acute on chronic low back pain, referred by [facility]. Pt has done PT in the past for prior episodes, has ultimately needed epidural injections in the past. Pt has constant central left sided  low back pain occasional shooting pain in left lower lateral leg along fibularis distribution. Pt has been limited from strength/conditioning type exercise for years due to fears of aggravating back spasms, but also has been unable to perform treadmill walking for exercise. 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   PAIN: Aggravating factors: sitting on a hard chair for a long period of time (1-2 hours) (soft chair is fine), bending over to do chores, standing for about 10-15 minutes.    Are you having pain?  Yes; 5/10 current, 5/10 best pain in past 7 day average, 6-8/10 worse pain (low back, central lower, left); leg pain symptoms are intermittent and shooting only.   PRECAUTIONS: None RED FLAGS: Pt denies any change in bowel/bladder, denies recent weight loss unexplained in origin, denies any nigh sweats, denies night terrors, denies saddle paresthesias.   WEIGHT BEARING RESTRICTIONS: None  FALLS:  Has patient fallen in last 6 months? Nope  OCCUPATION: Works full time, Health and safety inspector job, Designer, multimedia social services PLOF: prior to this episode, walking for exercise on treadmill; (~8 months prior); unlimited AMB in community.   PATIENT GOALS:  Be able to freely participate in community mobility ad lib without end-of-day back pain Be able to resume self selected fitness activity for the self management of  wellness   OBJECTIVE:  Note: Objective measures were completed at Evaluation unless otherwise noted.  DIAGNOSTIC FINDINGS:  Only able to find imaging results from as recent as 2017  IMPRESSION: L3-4: Shallow left posterolateral disc protrusion. Mild facet hypertrophy. Narrowing of the left lateral recess that could possibly cause neural compression.   L4-5:  Disc bulge.  No compressive stenosis.   L5-S1: Disc bulge. Mild facet hypertrophy more on the left. Narrowing of the subarticular lateral recess on the left that could affect the S1 nerve root.    Electronically Signed   By: [name] M.D.   On: 11/13/2016 10:53  PATIENT SURVEYS:  MODI: 40%   SENSATION: Pt denies any acute or chronic change to sensorium; assessment deferred to referring provider.   LUMBAR ROM:  Lumbar flexion - (standing toe touch) - no effect on symptoms - maintains slight lumbar lordosis Lumbar extension - (standing arch, hands on pelvis) - no effect on symptoms - lordosis to end range > in lower segments than in upper T10-L2  Lumbar Rotation Right - (standing, WBOS, full body rotation) - no effect on symptoms - ~80 degrees rotation at T1 level (WNL)  Lumbar Rotation Left - (standing, WBOS, full body rotation) - no effect on symptoms - ~80 degrees rotation at T1 level (WNL)  LUMBAR SPECIAL TESTS:  Passive Straight Leg Raise: Left  -negative SLR, negative with ankle DF, negative with ankle DF + cervical flexion  FUNCTIONAL TESTS:  -Overground AMB gait assessment, 4x47ft: No device used, gait speed appropriate for distance, setting, no LOB, no frank antalgia   grossly unremarkable, very slight reduction in DF time after heel strike Left, very minimal foot slap Left, cannot attribute to frank weakness issue -Tip toe walking, heel walking (L5/S1 screening): WNl  Pt able to demonstrate both over 85ft without frank asymmetry of motor function ankles  -30 sec chair rise: 10x (aware of, but   not limited by chronic pain in low back, chronic soreness/joint noise in Rt knee (DJD). No change in  symptoms.   -Isometric deadbug: holds for 30sec, moderate effort, moderate increase in back discomfort.    Today's Intervention 05/28/24     Neuromuscular re education  Seated   transversus abdominis and glute max contraction 4x10 seconds    Increased low back discomfort   Transversus abdominis contraction only 10x10 seconds   Feels good, decreased pain   Glute max squeeze 10x10 seconds   Feels good, decreased pain  Reclined   Hooklying   Lower trunk rotation 10x5 seconds each side    Hip extension with leg straight with 2 pillows under straight leg    L 10x10 seconds    R 10x10 seconds    Crunch 5x6      Improved exercise technique, movement at target joints, use of target muscles after mod verbal, visual, tactile cues.     PATIENT EDUCATION:  Education details: Home lumbar flexion stretch; using pool for physical activity until able to resume treadmill walking.  Person educated: [place]  Education method: collaborative learning, deliberate practice, positive reinforcement, explicit instruction, establish rules. Education comprehension: Good  HOME EXERCISE PROGRAM: -Lumbar flexion/decompression stretch in bed or floor x5 minutes, QD; 05/22/24  -Supine, legs elevated, hips and knees at 90 and 90: (lumbar flexion stretch and spinal decompression) x 3 minutes  Pt denies any subjective stretch in low back, attribute to >typical hip joint ROM.   Discussed a few ways to perform at home, asking to perform daily x5 minutes.   Pt was recommended to perform this exercise on her bed instead of the floor. Pt verbalized understanding 05/23/2024    Reclined   Hooklying   Hip extension with leg straight with 1-2 pillows under straight leg    L 10x10 seconds for 3 sets    R 10x10 seconds for 3 sets   Access Code: [email or web address] URL: [email or web address] Date:  05/23/2024 Prepared by: Emil Glassman  Exercises - Seated Transversus Abdominis Bracing  - 8 x daily - 7 x weekly - 1 sets - 5 reps - 30 seconds hold - Seated Gluteal Sets  - 8 x daily - 7 x weekly - 1 sets - 5 reps - 30 seconds hold  - Supine Lower Trunk Rotation  - 1 x daily - 7 x weekly - 3 sets - 10 reps - 5 seconds hold   ASSESSMENT:  CLINICAL IMPRESSION: Continued working on core and glute strengthening to help decrease stress to low back.  Decreased back pain to 4-5/10 reported after session. Pt will benefit from continued skilled physical therapy services to decrease pain, improve strength and function.     OBJECTIVE IMPAIRMENTS: Decreased knowledge of condition, decreased use of DME, decreased mobility, difficulty walking, decreased strength, decreased ROM. ACTIVITY LIMITATIONS: Lifting, standing, walking, squatting, transfers, locomotion level PARTICIPATION LIMITATIONS: Cleaning, laundry, interpersonal relationships, driving, yardwork, community activity.  PERSONAL FACTORS: Age, behavior pattern, education, past/current experiences, transportation, profession  are also affecting patient's functional outcome.  REHAB POTENTIAL: Fair CLINICAL DECISION MAKING: Evolving/moderate complexity EVALUATION COMPLEXITY: Medium  GOALS: Goals reviewed with patient? Yes  SHORT TERM GOALS: Target date: 06/20/24  Pt to report resuming physical activity as desired in a modified capacity that has no negative effect on low back or leg symptoms pain.  Baseline: unable to exercise due to symptoms response  Goal status: INITIAL  2.  Pt to report improved weekly average pain score by >2 points.  Baseline: 5/10  Goal status: INITIAL  LONG TERM GOALS: Target date: 07/21/24  Pt to report increase in ability  to perform community AMB for IADL and leisure without significant increase in back pain symptoms.  Baseline: limited compared to baseline.  Goal status: INITIAL  2.  MODI score improvement  by >15% to indicate reduced back-related disability.  Baseline: MODI 40%  Goal status: INITIAL  3.  Pt to demonstrate >1425ft without increase in back pain >1/10.  Baseline:  Goal status: INITIAL  4.  Pt to demonstrate isometric deadbug 15x15sec without increased back pain to demonstrate improved abd motor function and improved tolerance to core muscle use.  Baseline: 30sec with moderate effort and moderate pain increase.  Goal status: INITIAL  5.  Pt to report able to return to reduced volume treadmill walking workouts without >2/10 low back increase same day.  Baseline:  Goal status: INITIAL  PLAN: PT FREQUENCY: 1-2x/week  PT DURATION: 6 weeks PLANNED INTERVENTIONS: 97110-Therapeutic exercises, 97530- Therapeutic activity, 97112- Neuromuscular re-education, 97535- Self Care, 02859- Manual therapy, G0283- Electrical stimulation (unattended), Patient/Family education, Balance training, Stair training, Joint mobilization, Spinal mobilization, Cognitive remediation, Cryotherapy, and Moist heat. PLAN FOR NEXT SESSION: complete remaining tests and measures, expand on HEP, FU on access to pool for aerobic exercise.    Yamilett Anastos, PT, DPT 05/28/2024, 9:46 AM  9:46 AM, 05/28/24  Physical Therapist - [place] [phone] (Office)

## 2024-05-30 ENCOUNTER — Ambulatory Visit

## 2024-06-04 ENCOUNTER — Ambulatory Visit

## 2024-06-04 DIAGNOSIS — R262 Difficulty in walking, not elsewhere classified: Secondary | ICD-10-CM

## 2024-06-04 DIAGNOSIS — M5459 Other low back pain: Secondary | ICD-10-CM | POA: Diagnosis not present

## 2024-06-04 NOTE — Therapy (Signed)
 OUTPATIENT PHYSICAL THERAPY TREATMENT  Patient Name: Regina Whitaker MRN: [id number] DOB:01-Mar-1976, 48 y.o., female Today's Date: 06/04/2024  END OF SESSION:  PT End of Session - 06/04/24 0913     Visit Number 4    Number of Visits 12    Date for PT Re-Evaluation 07/02/24    Authorization Type [employer] Managed    Authorization Time Period 05/21/24-07/02/24    Progress Note Due on Visit 10    PT Start Time 0914   Pt arrived at wrong day and time.   PT Stop Time 0945    PT Time Calculation (min) 31 min    Activity Tolerance Patient tolerated treatment well    Behavior During Therapy WFL for tasks assessed/performed             Past Medical History:  Diagnosis Date   Anxiety    Bleeding nose    Calf pain    when walking   Cough    Depression    Frequent headaches    GERD (gastroesophageal reflux disease)    IBS (irritable bowel syndrome)    Nerve pain    Sinus problem    Past Surgical History:  Procedure Laterality Date   arm surgery     right   CESAREAN SECTION     MOUTH SURGERY     Patient Active Problem List   Diagnosis Date Noted   Chronic migraine w/o aura w/o status migrainosus, not intractable 09/05/2023   Tic 09/05/2023   Daytime somnolence 05/20/2019   Prediabetes 04/26/2019   Chronic anxiety 12/12/2018   GERD without esophagitis 12/12/2018   Mild intermittent asthma without complication 12/12/2018    PCP: Silver Lamar LABOR, MD REFERRING PROVIDER: Con Hamel, MD  REFERRING DIAG: Lumbar radiculopathy  Rationale for Evaluation and Treatment: Rehabilitation  THERAPY DIAG:  Other low back pain  Difficulty in walking, not elsewhere classified  ONSET DATE: Chronic with acute exacerbation x6-8 months  SUBJECTIVE:                                                                                                                                                                                           SUBJECTIVE STATEMENT: Back is about a 4-5/10  currently.       PERTINENT HISTORY:  47yoF with acute on chronic low back pain, referred by [name]. Pt has done PT in the past for prior episodes, has ultimately needed epidural injections in the past. Pt has constant central left sided low back pain occasional shooting pain in left lower lateral leg along fibularis distribution. Pt has been limited from strength/conditioning type exercise for years  due to fears of aggravating back spasms, but also has been unable to perform treadmill walking for exercise. 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   PAIN: Aggravating factors: sitting on a hard chair for a long period of time (1-2 hours) (soft chair is fine), bending over to do chores, standing for about 10-15 minutes.    Are you having pain?  Yes; 5/10 current, 5/10 best pain in past 7 day average, 6-8/10 worse pain (low back, central lower, left); leg pain symptoms are intermittent and shooting only.   PRECAUTIONS: None RED FLAGS: Pt denies any change in bowel/bladder, denies recent weight loss unexplained in origin, denies any nigh sweats, denies night terrors, denies saddle paresthesias.   WEIGHT BEARING RESTRICTIONS: None  FALLS:  Has patient fallen in last 6 months? Nope  OCCUPATION: Works full time, Health and safety inspector job, Designer, multimedia social services PLOF: prior to this episode, walking for exercise on treadmill; (~8 months prior); unlimited AMB in community.   PATIENT GOALS:  Be able to freely participate in community mobility ad lib without end-of-day back pain Be able to resume self selected fitness activity for the self management of wellness   OBJECTIVE:  Note: Objective measures were completed at Evaluation unless otherwise noted.  DIAGNOSTIC FINDINGS:  Only able to find imaging results from as recent as  2017  IMPRESSION: L3-4: Shallow left posterolateral disc protrusion. Mild facet hypertrophy. Narrowing of the left lateral recess that could possibly cause neural compression.   L4-5:  Disc bulge.  No compressive stenosis.   L5-S1: Disc bulge. Mild facet hypertrophy more on the left. Narrowing of the subarticular lateral recess on the left that could affect the S1 nerve root.    Electronically Signed   By: [name] M.D.   On: 11/13/2016 10:53  PATIENT SURVEYS:  MODI: 40%   SENSATION: Pt denies any acute or chronic change to sensorium; assessment deferred to referring provider.   LUMBAR ROM:  Lumbar flexion - (standing toe touch) - no effect on symptoms - maintains slight lumbar lordosis Lumbar extension - (standing arch, hands on pelvis) - no effect on symptoms - lordosis to end range > in lower segments than in upper T10-L2  Lumbar Rotation Right - (standing, WBOS, full body rotation) - no effect on symptoms - ~80 degrees rotation at T1 level (WNL)  Lumbar Rotation Left - (standing, WBOS, full body rotation) - no effect on symptoms - ~80 degrees rotation at T1 level (WNL)  LUMBAR SPECIAL TESTS:  Passive Straight Leg Raise: Left  -negative SLR, negative with ankle DF, negative with ankle DF + cervical flexion  FUNCTIONAL TESTS:  -Overground AMB gait assessment, 4x53ft: No device used, gait speed appropriate for distance, setting, no LOB, no frank antalgia   grossly unremarkable, very slight reduction in DF time after heel strike Left, very minimal foot slap Left, cannot attribute to frank weakness issue -Tip toe walking, heel walking (L5/S1 screening): WNl  Pt able to demonstrate both over 16ft without frank asymmetry of motor function ankles  -30 sec chair rise: 10x (aware of, but  not limited by chronic pain in low back, chronic soreness/joint noise in Rt knee (DJD). No change in symptoms.   -Isometric deadbug: holds for 30sec, moderate effort, moderate  increase in back discomfort.    Today's Intervention 06/04/24  Neuromuscular re education  Hooklying  Lower trunk rotation 10x5 seconds each side  Reclined   Hooklying   Lower trunk rotation 10x5 seconds each side      Crunch 10x3  Supine posterior pelvic 5 seconds. Increased pressure to low back  Supine Mini bridge  5x, then 11x, then 5x  Hooklying   Transversus abdominis contraction with march 10x3 each LE   Seated B shoulder extension red band 10x3       Improved exercise technique, movement at target joints, use of target muscles after mod verbal, visual, tactile cues.     PATIENT EDUCATION:  Education details: Home lumbar flexion stretch; using pool for physical activity until able to resume treadmill walking.  Person educated: [place]  Education method: collaborative learning, deliberate practice, positive reinforcement, explicit instruction, establish rules. Education comprehension: Good  HOME EXERCISE PROGRAM: -Lumbar flexion/decompression stretch in bed or floor x5 minutes, QD; 05/22/24  -Supine, legs elevated, hips and knees at 90 and 90: (lumbar flexion stretch and spinal decompression) x 3 minutes  Pt denies any subjective stretch in low back, attribute to >typical hip joint ROM.   Discussed a few ways to perform at home, asking to perform daily x5 minutes.   Pt was recommended to perform this exercise on her bed instead of the floor. Pt verbalized understanding 05/23/2024    Reclined   Hooklying   Hip extension with leg straight with 1-2 pillows under straight leg    L 10x10 seconds for 3 sets    R 10x10 seconds for 3 sets   Access Code: [email or web address] URL: [email or web address] Date: 05/23/2024 Prepared by: Emil Glassman  Exercises - Seated Transversus Abdominis Bracing  - 8 x daily - 7 x weekly - 1 sets - 5 reps - 30 seconds hold - Seated Gluteal Sets  - 8 x daily - 7 x weekly - 1 sets - 5 reps - 30 seconds hold  - Supine Lower  Trunk Rotation  - 1 x daily - 7 x weekly - 3 sets - 10 reps - 5 seconds hold   ASSESSMENT:  CLINICAL IMPRESSION: Pt arrived late and at wrong day so session was adjusted accordingly. Decreased starting low back pain levels today compared to previous session. Continued working on core strengthening to help decrease stress to low back.  Decreased low back pain reported at end of session.  Pt will benefit from continued skilled physical therapy services to decrease pain, improve strength and function.     OBJECTIVE IMPAIRMENTS: Decreased knowledge of condition, decreased use of DME, decreased mobility, difficulty walking, decreased strength, decreased ROM. ACTIVITY LIMITATIONS: Lifting, standing, walking, squatting, transfers, locomotion level PARTICIPATION LIMITATIONS: Cleaning, laundry, interpersonal relationships, driving, yardwork, community activity.  PERSONAL FACTORS: Age, behavior pattern, education, past/current experiences, transportation, profession  are also affecting patient's functional outcome.  REHAB POTENTIAL: Fair CLINICAL DECISION MAKING: Evolving/moderate complexity EVALUATION COMPLEXITY: Medium  GOALS: Goals reviewed with patient? Yes  SHORT TERM GOALS: Target date: 06/20/24  Pt to report resuming physical activity as desired in a modified capacity that has no negative effect on low back or leg symptoms pain.  Baseline: unable to exercise due to symptoms response  Goal status: INITIAL  2.  Pt to report improved weekly average pain score by >2 points.  Baseline: 5/10  Goal status: INITIAL  LONG TERM GOALS: Target date: 07/21/24  Pt to report increase in ability to perform community AMB for IADL and leisure without significant increase in back pain symptoms.  Baseline: limited compared  to baseline.  Goal status: INITIAL  2.  MODI score improvement by >15% to indicate reduced back-related disability.  Baseline: MODI 40%  Goal status: INITIAL  3.  Pt to  demonstrate >1468ft without increase in back pain >1/10.  Baseline:  Goal status: INITIAL  4.  Pt to demonstrate isometric deadbug 15x15sec without increased back pain to demonstrate improved abd motor function and improved tolerance to core muscle use.  Baseline: 30sec with moderate effort and moderate pain increase.  Goal status: INITIAL  5.  Pt to report able to return to reduced volume treadmill walking workouts without >2/10 low back increase same day.  Baseline:  Goal status: INITIAL  PLAN: PT FREQUENCY: 1-2x/week  PT DURATION: 6 weeks PLANNED INTERVENTIONS: 97110-Therapeutic exercises, 97530- Therapeutic activity, 97112- Neuromuscular re-education, 97535- Self Care, 02859- Manual therapy, G0283- Electrical stimulation (unattended), Patient/Family education, Balance training, Stair training, Joint mobilization, Spinal mobilization, Cognitive remediation, Cryotherapy, and Moist heat. PLAN FOR NEXT SESSION: complete remaining tests and measures, expand on HEP, FU on access to pool for aerobic exercise.    Ronney Honeywell, PT, DPT 06/04/2024, 9:51 AM  9:51 AM, 06/04/24  Physical Therapist - [place] [phone] (Office)

## 2024-06-05 ENCOUNTER — Ambulatory Visit

## 2024-06-07 ENCOUNTER — Ambulatory Visit

## 2024-06-10 ENCOUNTER — Ambulatory Visit

## 2024-06-10 DIAGNOSIS — R262 Difficulty in walking, not elsewhere classified: Secondary | ICD-10-CM

## 2024-06-10 DIAGNOSIS — M5459 Other low back pain: Secondary | ICD-10-CM | POA: Diagnosis not present

## 2024-06-10 NOTE — Therapy (Signed)
 OUTPATIENT PHYSICAL THERAPY TREATMENT  Patient Name: Regina Whitaker MRN: [id number] DOB:1975/12/16, 48 y.o., female Today's Date: 06/10/2024  END OF SESSION:  PT End of Session - 06/10/24 0818     Visit Number 5    Number of Visits 12    Date for PT Re-Evaluation 07/02/24    Authorization Type [employer] Managed    Authorization Time Period 05/21/24-07/02/24    Progress Note Due on Visit 10    PT Start Time 0818    PT Stop Time 0902    PT Time Calculation (min) 44 min    Activity Tolerance Patient tolerated treatment well    Behavior During Therapy [facility] for tasks assessed/performed              Past Medical History:  Diagnosis Date   Anxiety    Bleeding nose    Calf pain    when walking   Cough    Depression    Frequent headaches    GERD (gastroesophageal reflux disease)    IBS (irritable bowel syndrome)    Nerve pain    Sinus problem    Past Surgical History:  Procedure Laterality Date   arm surgery     right   CESAREAN SECTION     MOUTH SURGERY     Patient Active Problem List   Diagnosis Date Noted   Chronic migraine w/o aura w/o status migrainosus, not intractable 09/05/2023   Tic 09/05/2023   Daytime somnolence 05/20/2019   Prediabetes 04/26/2019   Chronic anxiety 12/12/2018   GERD without esophagitis 12/12/2018   Mild intermittent asthma without complication 12/12/2018    PCP: Silver Lamar LABOR, MD REFERRING PROVIDER: Con Hamel, MD  REFERRING DIAG: Lumbar radiculopathy  Rationale for Evaluation and Treatment: Rehabilitation  THERAPY DIAG:  Other low back pain  Difficulty in walking, not elsewhere classified  ONSET DATE: Chronic with acute exacerbation x6-8 months  SUBJECTIVE:                                                                                                                                                                                           SUBJECTIVE STATEMENT: Back is about a 3-4/10 currently. Back usually bothers her  in the morning. Tends to sleep on her back and not rotate.        PERTINENT HISTORY:  47yoF with acute on chronic low back pain, referred by [name]. Pt has done PT in the past for prior episodes, has ultimately needed epidural injections in the past. Pt has constant central left sided low back pain occasional shooting pain in left lower lateral leg along fibularis distribution.  Pt has been limited from strength/conditioning type exercise for years due to fears of aggravating back spasms, but also has been unable to perform treadmill walking for exercise. 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   PAIN: Aggravating factors: sitting on a hard chair for a long period of time (1-2 hours) (soft chair is fine), bending over to do chores, standing for about 10-15 minutes.    Are you having pain?  Yes; 5/10 current, 5/10 best pain in past 7 day average, 6-8/10 worse pain (low back, central lower, left); leg pain symptoms are intermittent and shooting only.   PRECAUTIONS: None RED FLAGS: Pt denies any change in bowel/bladder, denies recent weight loss unexplained in origin, denies any nigh sweats, denies night terrors, denies saddle paresthesias.   WEIGHT BEARING RESTRICTIONS: None  FALLS:  Has patient fallen in last 6 months? Nope  OCCUPATION: Works full time, Health and safety inspector job, Designer, multimedia social services PLOF: prior to this episode, walking for exercise on treadmill; (~8 months prior); unlimited AMB in community.   PATIENT GOALS:  Be able to freely participate in community mobility ad lib without end-of-day back pain Be able to resume self selected fitness activity for the self management of wellness   OBJECTIVE:  Note: Objective measures were completed at Evaluation unless otherwise noted.  DIAGNOSTIC FINDINGS:   Only able to find imaging results from as recent as 2017  IMPRESSION: L3-4: Shallow left posterolateral disc protrusion. Mild facet hypertrophy. Narrowing of the left lateral recess that could possibly cause neural compression.   L4-5:  Disc bulge.  No compressive stenosis.   L5-S1: Disc bulge. Mild facet hypertrophy more on the left. Narrowing of the subarticular lateral recess on the left that could affect the S1 nerve root.    Electronically Signed   By: [name] M.D.   On: 11/13/2016 10:53  PATIENT SURVEYS:  MODI: 40%   SENSATION: Pt denies any acute or chronic change to sensorium; assessment deferred to referring provider.   LUMBAR ROM:  Lumbar flexion - (standing toe touch) - no effect on symptoms - maintains slight lumbar lordosis Lumbar extension - (standing arch, hands on pelvis) - no effect on symptoms - lordosis to end range > in lower segments than in upper T10-L2  Lumbar Rotation Right - (standing, WBOS, full body rotation) - no effect on symptoms - ~80 degrees rotation at T1 level (WNL)  Lumbar Rotation Left - (standing, WBOS, full body rotation) - no effect on symptoms - ~80 degrees rotation at T1 level (WNL)  LUMBAR SPECIAL TESTS:  Passive Straight Leg Raise: Left  -negative SLR, negative with ankle DF, negative with ankle DF + cervical flexion  FUNCTIONAL TESTS:  -Overground AMB gait assessment, 4x77ft: No device used, gait speed appropriate for distance, setting, no LOB, no frank antalgia   grossly unremarkable, very slight reduction in DF time after heel strike Left, very minimal foot slap Left, cannot attribute to frank weakness issue -Tip toe walking, heel walking (L5/S1 screening): WNl  Pt able to demonstrate both over 34ft without frank asymmetry of motor function ankles  -30 sec chair rise: 10x (aware of, but  not limited by chronic pain in low back, chronic soreness/joint noise in Rt knee (DJD). No change in symptoms.   -Isometric  deadbug: holds for 30sec, moderate effort, moderate increase in back  discomfort.    Today's Intervention 06/10/24     Neuromuscular re education  Performed to promote trunk and glute strength to promote lumbopelvic stability and decrease stress to her low back.    Pt was recommended to prop B knees up with 2 pillows to decrease extension stress to her low back when sleeping in supine with her CPAP.   Standing hip flexor stretch  R 30 seconds   R knee pain in closed chait  Supine hip extension stretch   R 30 seconds x 3  L 30 seconds x 3  Reclined   Hooklying   Crunch 10x3  Supine Mini bridge 10x3  Standing, bent over onto elevated mat table   Hip extension    R 10x2   L 10x2   Standing with R arch supported. Decreased R knee pain  Recommended arch supports.   Standing hip abduction with B UE assist   R 10x  L 10x  Seated hip hinging 10x to promote lumbopelvic stability and decrease lumbar extension hinging when standing up from sitting.       Improved exercise technique, movement at target joints, use of target muscles after mod verbal, visual, tactile cues.     PATIENT EDUCATION:  Education details: Home lumbar flexion stretch; using pool for physical activity until able to resume treadmill walking.  Person educated: Elvie  Education method: collaborative learning, deliberate practice, positive reinforcement, explicit instruction, establish rules. Education comprehension: Good  HOME EXERCISE PROGRAM: -Lumbar flexion/decompression stretch in bed or floor x5 minutes, QD; 05/22/24  -Supine, legs elevated, hips and knees at 90 and 90: (lumbar flexion stretch and spinal decompression) x 3 minutes  Pt denies any subjective stretch in low back, attribute to >typical hip joint ROM.   Discussed a few ways to perform at home, asking to perform daily x5 minutes.   Pt was recommended to perform this exercise on her bed instead of the floor. Pt verbalized  understanding 05/23/2024    Reclined   Hooklying   Hip extension with leg straight with 1-2 pillows under straight leg    L 10x10 seconds for 3 sets    R 10x10 seconds for 3 sets   Access Code: [email or web address] URL: [email or web address] Date: 05/23/2024 Prepared by: Emil Glassman  Exercises - Seated Transversus Abdominis Bracing  - 8 x daily - 7 x weekly - 1 sets - 5 reps - 30 seconds hold - Seated Gluteal Sets  - 8 x daily - 7 x weekly - 1 sets - 5 reps - 30 seconds hold  - Supine Lower Trunk Rotation  - 1 x daily - 7 x weekly - 3 sets - 10 reps - 5 seconds hold   - Modified Thomas Stretch  - 3 x daily - 7 x weekly - 1 sets - 5 reps - 30 seconds hold    ASSESSMENT:  CLINICAL IMPRESSION: Continued working on imprpoving trunk and glute med and max strength to decrease stress to low back. Difficulty performing standing exercises for trunk and glute strengthening secondary to R medial and lateral knee joint pain, which decreased with support of R plantar arch to promote better positioning and mechanics at R knee. Pt was recommended to wear arch supports. Good muscle use reported with exercises. No low back and R knee pain reported during gait at end of session.  Pt will benefit from continued skilled physical therapy services to decrease pain, improve strength and function.     OBJECTIVE IMPAIRMENTS: Decreased knowledge of condition,  decreased use of DME, decreased mobility, difficulty walking, decreased strength, decreased ROM. ACTIVITY LIMITATIONS: Lifting, standing, walking, squatting, transfers, locomotion level PARTICIPATION LIMITATIONS: Cleaning, laundry, interpersonal relationships, driving, yardwork, community activity.  PERSONAL FACTORS: Age, behavior pattern, education, past/current experiences, transportation, profession  are also affecting patient's functional outcome.  REHAB POTENTIAL: Fair CLINICAL DECISION MAKING: Evolving/moderate complexity EVALUATION  COMPLEXITY: Medium  GOALS: Goals reviewed with patient? Yes  SHORT TERM GOALS: Target date: 06/20/24  Pt to report resuming physical activity as desired in a modified capacity that has no negative effect on low back or leg symptoms pain.  Baseline: unable to exercise due to symptoms response  Goal status: INITIAL  2.  Pt to report improved weekly average pain score by >2 points.  Baseline: 5/10  Goal status: INITIAL  LONG TERM GOALS: Target date: 07/21/24  Pt to report increase in ability to perform community AMB for IADL and leisure without significant increase in back pain symptoms.  Baseline: limited compared to baseline.  Goal status: INITIAL  2.  MODI score improvement by >15% to indicate reduced back-related disability.  Baseline: MODI 40%  Goal status: INITIAL  3.  Pt to demonstrate >1463ft without increase in back pain >1/10.  Baseline:  Goal status: INITIAL  4.  Pt to demonstrate isometric deadbug 15x15sec without increased back pain to demonstrate improved abd motor function and improved tolerance to core muscle use.  Baseline: 30sec with moderate effort and moderate pain increase.  Goal status: INITIAL  5.  Pt to report able to return to reduced volume treadmill walking workouts without >2/10 low back increase same day.  Baseline:  Goal status: INITIAL  PLAN: PT FREQUENCY: 1-2x/week  PT DURATION: 6 weeks PLANNED INTERVENTIONS: 97110-Therapeutic exercises, 97530- Therapeutic activity, 97112- Neuromuscular re-education, 97535- Self Care, 02859- Manual therapy, G0283- Electrical stimulation (unattended), Patient/Family education, Balance training, Stair training, Joint mobilization, Spinal mobilization, Cognitive remediation, Cryotherapy, and Moist heat. PLAN FOR NEXT SESSION: complete remaining tests and measures, expand on HEP, FU on access to pool for aerobic exercise.    Xandria Gallaga, PT, DPT 06/10/2024, 10:13 AM  10:13 AM, 06/10/24  Physical Therapist -  [place] [phone] (Office)

## 2024-06-12 ENCOUNTER — Ambulatory Visit

## 2024-06-12 DIAGNOSIS — M5459 Other low back pain: Secondary | ICD-10-CM | POA: Diagnosis not present

## 2024-06-12 DIAGNOSIS — R262 Difficulty in walking, not elsewhere classified: Secondary | ICD-10-CM

## 2024-06-12 NOTE — Therapy (Signed)
 OUTPATIENT PHYSICAL THERAPY TREATMENT  Patient Name: Regina Whitaker MRN: [id number] DOB:[birth year], 48 y.o., female Today's Date: 06/12/2024  END OF SESSION:  PT End of Session - 06/12/24 0952     Visit Number 6    Number of Visits 12    Date for PT Re-Evaluation 07/02/24    Authorization Type [employer] Managed    Authorization Time Period 05/21/24-07/02/24    Progress Note Due on Visit 10    PT Start Time [id number]    PT Stop Time 1034    PT Time Calculation (min) 41 min    Activity Tolerance Patient tolerated treatment well    Behavior During Therapy [facility] for tasks assessed/performed               Past Medical History:  Diagnosis Date   Anxiety    Bleeding nose    Calf pain    when walking   Cough    Depression    Frequent headaches    GERD (gastroesophageal reflux disease)    IBS (irritable bowel syndrome)    Nerve pain    Sinus problem    Past Surgical History:  Procedure Laterality Date   arm surgery     right   CESAREAN SECTION     MOUTH SURGERY     Patient Active Problem List   Diagnosis Date Noted   Chronic migraine w/o aura w/o status migrainosus, not intractable 09/05/2023   Tic 09/05/2023   Daytime somnolence 05/20/2019   Prediabetes 04/26/2019   Chronic anxiety 12/12/2018   GERD without esophagitis 12/12/2018   Mild intermittent asthma without complication 12/12/2018    PCP: [facility] LABOR, MD REFERRING PROVIDER: Con Hamel, MD  REFERRING DIAG: Lumbar radiculopathy  Rationale for Evaluation and Treatment: Rehabilitation  THERAPY DIAG:  Other low back pain  Difficulty in walking, not elsewhere classified  ONSET DATE: Chronic with acute exacerbation x6-8 months  SUBJECTIVE:                                                                                                                                                                                           SUBJECTIVE STATEMENT: Back is about a 3/10 today. 5/10 low back pain at worst  for the past 7 days. Forgot to prop her legs up with pillows when sleeping for her back.         PERTINENT HISTORY:  47yoF with acute on chronic low back pain, referred [name]. Pt has done PT in the past for prior episodes, has ultimately needed epidural injections in the past. Pt has constant central left sided low back pain occasional  shooting pain in left lower lateral leg along fibularis distribution. Pt has been limited from strength/conditioning type exercise for years due to fears of aggravating back spasms, but also has been unable to perform treadmill walking for exercise. 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   PAIN: Aggravating factors: sitting on a hard chair for a long period of time (1-2 hours) (soft chair is fine), bending over to do chores, standing for about 10-15 minutes.    Are you having pain?  Yes; 5/10 current, 5/10 best pain in past 7 day average, 6-8/10 worse pain (low back, central lower, left); leg pain symptoms are intermittent and shooting only.   PRECAUTIONS: None RED FLAGS: Pt denies any change in bowel/bladder, denies recent weight loss unexplained in origin, denies any nigh sweats, denies night terrors, denies saddle paresthesias.   WEIGHT BEARING RESTRICTIONS: None  FALLS:  Has patient fallen in last 6 months? Nope  OCCUPATION: Works full time, Health and safety inspector job, Designer, multimedia social services PLOF: prior to this episode, walking for exercise on treadmill; (~8 months prior); unlimited AMB in community.   PATIENT GOALS:  Be able to freely participate in community mobility ad lib without end-of-day back pain Be able to resume self selected fitness activity for the self management of wellness   OBJECTIVE:  Note: Objective measures were completed at Evaluation unless otherwise  noted.  DIAGNOSTIC FINDINGS:  Only able to find imaging results from as recent as 2017  IMPRESSION: L3-4: Shallow left posterolateral disc protrusion. Mild facet hypertrophy. Narrowing of the left lateral recess that could possibly cause neural compression.   L4-5:  Disc bulge.  No compressive stenosis.   L5-S1: Disc bulge. Mild facet hypertrophy more on the left. Narrowing of the subarticular lateral recess on the left that could affect the S1 nerve root.    Electronically Signed   By: [name] M.D.   On: 11/13/2016 10:53  PATIENT SURVEYS:  MODI: 40%   SENSATION: Pt denies any acute or chronic change to sensorium; assessment deferred to referring provider.   LUMBAR ROM:  Lumbar flexion - (standing toe touch) - no effect on symptoms - maintains slight lumbar lordosis Lumbar extension - (standing arch, hands on pelvis) - no effect on symptoms - lordosis to end range > in lower segments than in upper T10-L2  Lumbar Rotation Right - (standing, WBOS, full body rotation) - no effect on symptoms - ~80 degrees rotation at T1 level (WNL)  Lumbar Rotation Left - (standing, WBOS, full body rotation) - no effect on symptoms - ~80 degrees rotation at T1 level (WNL)  LUMBAR SPECIAL TESTS:  Passive Straight Leg Raise: Left  -negative SLR, negative with ankle DF, negative with ankle DF + cervical flexion  Long sit test suggests anterior nutation L innominate (06/12/2024)   FUNCTIONAL TESTS:  -Overground AMB gait assessment, 4x27ft: No device used, gait speed appropriate for distance, setting, no LOB, no frank antalgia   grossly unremarkable, very slight reduction in DF time after heel strike Left, very minimal foot slap Left, cannot attribute to frank weakness issue -Tip toe walking, heel walking (L5/S1 screening): WNl  Pt able to demonstrate both over 74ft without frank asymmetry of motor function ankles  -30 sec chair rise: 10x (aware of, but  not limited by chronic pain  in low back, chronic soreness/joint noise  in Rt knee (DJD). No change in symptoms.   -Isometric deadbug: holds for 30sec, moderate effort, moderate increase in back discomfort.    Today's Intervention 06/12/24     Neuromuscular re education  Performed to promote trunk and glute strength to promote lumbopelvic stability and decrease stress to her low back.   Supine SKTC with towel assist   L 10x5 seconds   Supine Mini bridge 10x3  Reclined   Hooklying   Crunch 10x3  Forward step up onto first regular step with L LE and B UE assist  10x3  Decreased L low back pain  Single leg mini dead lift with contralateral UE assist   L 10x3  Standing hip abduction with B UE assist   R 10x2  L 10x2  Standing, bent over onto elevated mat table   Hip extension    R 10x   L 10x  Improved exercise technique, movement at target joints, use of target muscles after mod verbal, visual, tactile cues.     PATIENT EDUCATION:  Education details: Home lumbar flexion stretch; using pool for physical activity until able to resume treadmill walking.  Person educated: Tonnya  Education method: collaborative learning, deliberate practice, positive reinforcement, explicit instruction, establish rules. Education comprehension: Good  HOME EXERCISE PROGRAM: -Lumbar flexion/decompression stretch in bed or floor x5 minutes, QD; 05/22/24  -Supine, legs elevated, hips and knees at 90 and 90: (lumbar flexion stretch and spinal decompression) x 3 minutes  Pt denies any subjective stretch in low back, attribute to >typical hip joint ROM.   Discussed a few ways to perform at home, asking to perform daily x5 minutes.   Pt was recommended to perform this exercise on her bed instead of the floor. Pt verbalized understanding 05/23/2024    Reclined   Hooklying   Hip extension with leg straight with 1-2 pillows under straight leg    L 10x10 seconds for 3 sets    R 10x10 seconds for 3 sets    Access Code:  [email or web address] URL: [email or web address] Date: 05/23/2024 Prepared by: Emil Glassman  Exercises - Seated Transversus Abdominis Bracing  - 8 x daily - 7 x weekly - 1 sets - 5 reps - 30 seconds hold - Seated Gluteal Sets  - 8 x daily - 7 x weekly - 1 sets - 5 reps - 30 seconds hold  - Supine Lower Trunk Rotation  - 1 x daily - 7 x weekly - 3 sets - 10 reps - 5 seconds hold   - Modified Thomas Stretch  - 3 x daily - 7 x weekly - 1 sets - 5 reps - 30 seconds hold  - Curl Up with Arms Crossed  - 1 x daily - 7 x weekly - 2 sets - 10 reps   ASSESSMENT:  CLINICAL IMPRESSION: Decreased back pain at worst based on subjective reports. Continued working on improving trunk and glute med and max strength to decrease stress to low back. Pt tolerated session well without aggravation of symptoms.  Pt will benefit from continued skilled physical therapy services to decrease pain, improve strength and function.     OBJECTIVE IMPAIRMENTS: Decreased knowledge of condition, decreased use of DME, decreased mobility, difficulty walking, decreased strength, decreased ROM. ACTIVITY LIMITATIONS: Lifting, standing, walking, squatting, transfers, locomotion level PARTICIPATION LIMITATIONS: Cleaning, laundry, interpersonal relationships, driving, yardwork, community activity.  PERSONAL FACTORS: Age, behavior pattern, education, past/current experiences, transportation, profession  are also affecting patient's functional outcome.  REHAB POTENTIAL: Fair CLINICAL DECISION MAKING: Evolving/moderate  complexity EVALUATION COMPLEXITY: Medium  GOALS: Goals reviewed with patient? Yes  SHORT TERM GOALS: Target date: 06/20/24  Pt to report resuming physical activity as desired in a modified capacity that has no negative effect on low back or leg symptoms pain.  Baseline: unable to exercise due to symptoms response  Goal status: INITIAL  2.  Pt to report improved weekly average pain score by >2 points.   Baseline: 5/10  Goal status: INITIAL  LONG TERM GOALS: Target date: 07/21/24  Pt to report increase in ability to perform community AMB for IADL and leisure without significant increase in back pain symptoms.  Baseline: limited compared to baseline.  Goal status: INITIAL  2.  MODI score improvement by >15% to indicate reduced back-related disability.  Baseline: MODI 40%  Goal status: INITIAL  3.  Pt to demonstrate >1459ft without increase in back pain >1/10.  Baseline:  Goal status: INITIAL  4.  Pt to demonstrate isometric deadbug 15x15sec without increased back pain to demonstrate improved abd motor function and improved tolerance to core muscle use.  Baseline: 30sec with moderate effort and moderate pain increase.  Goal status: INITIAL  5.  Pt to report able to return to reduced volume treadmill walking workouts without >2/10 low back increase same day.  Baseline:  Goal status: INITIAL  PLAN: PT FREQUENCY: 1-2x/week  PT DURATION: 6 weeks PLANNED INTERVENTIONS: 97110-Therapeutic exercises, 97530- Therapeutic activity, 97112- Neuromuscular re-education, 97535- Self Care, 02859- Manual therapy, G0283- Electrical stimulation (unattended), Patient/Family education, Balance training, Stair training, Joint mobilization, Spinal mobilization, Cognitive remediation, Cryotherapy, and Moist heat. PLAN FOR NEXT SESSION: complete remaining tests and measures, expand on HEP, FU on access to pool for aerobic exercise.    Treyven Lafauci, PT, DPT 06/12/2024, 12:40 PM  12:40 PM, 06/12/24  Physical Therapist - [place] [phone] (Office)

## 2024-06-18 ENCOUNTER — Ambulatory Visit

## 2024-06-18 DIAGNOSIS — R262 Difficulty in walking, not elsewhere classified: Secondary | ICD-10-CM

## 2024-06-18 DIAGNOSIS — M5459 Other low back pain: Secondary | ICD-10-CM | POA: Diagnosis not present

## 2024-06-18 NOTE — Therapy (Signed)
 OUTPATIENT PHYSICAL THERAPY TREATMENT  Patient Name: Regina Whitaker MRN: [id number] DOB:1976/04/09, 48 y.o., female Today's Date: 06/18/2024  END OF SESSION:  PT End of Session - 06/18/24 0906     Visit Number 7    Number of Visits 12    Date for PT Re-Evaluation 07/02/24    Authorization Type [employer] Managed    Authorization Time Period 05/21/24-07/02/24    Progress Note Due on Visit 10    PT Start Time 0907    PT Stop Time 0945    PT Time Calculation (min) 38 min    Activity Tolerance Patient tolerated treatment well    Behavior During Therapy [facility] for tasks assessed/performed                Past Medical History:  Diagnosis Date   Anxiety    Bleeding nose    Calf pain    when walking   Cough    Depression    Frequent headaches    GERD (gastroesophageal reflux disease)    IBS (irritable bowel syndrome)    Nerve pain    Sinus problem    Past Surgical History:  Procedure Laterality Date   arm surgery     right   CESAREAN SECTION     MOUTH SURGERY     Patient Active Problem List   Diagnosis Date Noted   Chronic migraine w/o aura w/o status migrainosus, not intractable 09/05/2023   Tic 09/05/2023   Daytime somnolence 05/20/2019   Prediabetes 04/26/2019   Chronic anxiety 12/12/2018   GERD without esophagitis 12/12/2018   Mild intermittent asthma without complication 12/12/2018    PCP: Silver Lamar LABOR, MD REFERRING PROVIDER: Con Hamel, MD  REFERRING DIAG: Lumbar radiculopathy  Rationale for Evaluation and Treatment: Rehabilitation  THERAPY DIAG:  Other low back pain  Difficulty in walking, not elsewhere classified  ONSET DATE: Chronic with acute exacerbation x6-8 months  SUBJECTIVE:                                                                                                                                                                                           SUBJECTIVE STATEMENT: Back is about a 4-5/10 currently. Did pretty good  after last session.    PERTINENT HISTORY:  47yoF with acute on chronic low back pain, referred by [name]. Pt has done PT in the past for prior episodes, has ultimately needed epidural injections in the past. Pt has constant central left sided low back pain occasional shooting pain in left lower lateral leg along fibularis distribution. Pt has been limited from strength/conditioning type exercise for years due to  fears of aggravating back spasms, but also has been unable to perform treadmill walking for exercise. 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   PAIN: Aggravating factors: sitting on a hard chair for a long period of time (1-2 hours) (soft chair is fine), bending over to do chores, standing for about 10-15 minutes.    Are you having pain?  Yes; 5/10 current, 5/10 best pain in past 7 day average, 6-8/10 worse pain (low back, central lower, left); leg pain symptoms are intermittent and shooting only.   PRECAUTIONS: None RED FLAGS: Pt denies any change in bowel/bladder, denies recent weight loss unexplained in origin, denies any nigh sweats, denies night terrors, denies saddle paresthesias.   WEIGHT BEARING RESTRICTIONS: None  FALLS:  Has patient fallen in last 6 months? Nope  OCCUPATION: Works full time, Health and safety inspector job, Designer, multimedia social services PLOF: prior to this episode, walking for exercise on treadmill; (~8 months prior); unlimited AMB in community.   PATIENT GOALS:  Be able to freely participate in community mobility ad lib without end-of-day back pain Be able to resume self selected fitness activity for the self management of wellness   OBJECTIVE:  Note: Objective measures were completed at Evaluation unless otherwise noted.  DIAGNOSTIC FINDINGS:  Only able to find imaging results from as recent  as 2017  IMPRESSION: L3-4: Shallow left posterolateral disc protrusion. Mild facet hypertrophy. Narrowing of the left lateral recess that could possibly cause neural compression.   L4-5:  Disc bulge.  No compressive stenosis.   L5-S1: Disc bulge. Mild facet hypertrophy more on the left. Narrowing of the subarticular lateral recess on the left that could affect the S1 nerve root.    Electronically Signed   By: [name] M.D.   On: 11/13/2016 10:53  PATIENT SURVEYS:  MODI: 40%   SENSATION: Pt denies any acute or chronic change to sensorium; assessment deferred to referring provider.   LUMBAR ROM:  Lumbar flexion - (standing toe touch) - no effect on symptoms - maintains slight lumbar lordosis Lumbar extension - (standing arch, hands on pelvis) - no effect on symptoms - lordosis to end range > in lower segments than in upper T10-L2  Lumbar Rotation Right - (standing, WBOS, full body rotation) - no effect on symptoms - ~80 degrees rotation at T1 level (WNL)  Lumbar Rotation Left - (standing, WBOS, full body rotation) - no effect on symptoms - ~80 degrees rotation at T1 level (WNL)  LUMBAR SPECIAL TESTS:  Passive Straight Leg Raise: Left  -negative SLR, negative with ankle DF, negative with ankle DF + cervical flexion  Long sit test suggests anterior nutation L innominate (06/12/2024)   FUNCTIONAL TESTS:  -Overground AMB gait assessment, 4x62ft: No device used, gait speed appropriate for distance, setting, no LOB, no frank antalgia   grossly unremarkable, very slight reduction in DF time after heel strike Left, very minimal foot slap Left, cannot attribute to frank weakness issue -Tip toe walking, heel walking (L5/S1 screening): WNl  Pt able to demonstrate both over 88ft without frank asymmetry of motor function ankles  -30 sec chair rise: 10x (aware of, but  not limited by chronic pain in low back, chronic soreness/joint noise in Rt knee (DJD). No change in symptoms.    -Isometric deadbug: holds for 30sec, moderate effort, moderate increase in back discomfort.  Today's Intervention 06/18/24     Neuromuscular re education  Performed to promote trunk and glute strength to promote lumbopelvic stability and decrease stress to her low back.   Forward step up onto first regular step with L LE and B UE assist  10x3   Standing static mini lunge with contralateral UE assist   L 10x3  Standing B shoulder extension greeen band 10x2 with 5 second holds  Single leg mini dead lift with contralateral UE assist   L 10x3  Standing hip abduction with B UE assist   R 7x with 5 second holds, then 10x2  L 7x with 5 second holds, then 10x2  Supine bridge 10x3  Reclined   Hooklying   Crunch 10x3   Improved exercise technique, movement at target joints, use of target muscles after mod verbal, visual, tactile cues.     PATIENT EDUCATION:  Education details: Home lumbar flexion stretch; using pool for physical activity until able to resume treadmill walking.  Person educated: Tenisha  Education method: collaborative learning, deliberate practice, positive reinforcement, explicit instruction, establish rules. Education comprehension: Good  HOME EXERCISE PROGRAM: -Lumbar flexion/decompression stretch in bed or floor x5 minutes, QD; 05/22/24  -Supine, legs elevated, hips and knees at 90 and 90: (lumbar flexion stretch and spinal decompression) x 3 minutes  Pt denies any subjective stretch in low back, attribute to >typical hip joint ROM.   Discussed a few ways to perform at home, asking to perform daily x5 minutes.   Pt was recommended to perform this exercise on her bed instead of the floor. Pt verbalized understanding 05/23/2024    Reclined   Hooklying   Hip extension with leg straight with 1-2 pillows under straight leg    L 10x10 seconds for 3 sets    R 10x10 seconds for 3 sets    Access Code: [email or web address] URL:  [email or web address] Date: 05/23/2024 Prepared by: Emil Glassman  Exercises - Seated Transversus Abdominis Bracing  - 8 x daily - 7 x weekly - 1 sets - 5 reps - 30 seconds hold - Seated Gluteal Sets  - 8 x daily - 7 x weekly - 1 sets - 5 reps - 30 seconds hold  - Supine Lower Trunk Rotation  - 1 x daily - 7 x weekly - 3 sets - 10 reps - 5 seconds hold   - Modified Thomas Stretch  - 3 x daily - 7 x weekly - 1 sets - 5 reps - 30 seconds hold  - Curl Up with Arms Crossed  - 1 x daily - 7 x weekly - 2 sets - 10 reps - Forward Step Up with Counter Support  - 1 x daily - 7 x weekly - 3 sets - 10 reps - Standing Hip Abduction with Counter Support  - 1 x daily - 7 x weekly - 2 sets - 10 reps - Supine Bridge  - 1 x daily - 7 x weekly - 3 sets - 10 reps    ASSESSMENT:  CLINICAL IMPRESSION: Continued working on improving L glute max, B glute med and trunk muscle strengthening to decrease stress to low back in the upright position. Good glute med, max and trunk muscle use reported during exercises. No L low back pain reported after session.  Pt will benefit from continued skilled physical therapy services to decrease pain, improve strength and function.     OBJECTIVE IMPAIRMENTS: Decreased knowledge of condition, decreased use of DME, decreased mobility, difficulty walking, decreased strength, decreased  ROM. ACTIVITY LIMITATIONS: Lifting, standing, walking, squatting, transfers, locomotion level PARTICIPATION LIMITATIONS: Cleaning, laundry, interpersonal relationships, driving, yardwork, community activity.  PERSONAL FACTORS: Age, behavior pattern, education, past/current experiences, transportation, profession  are also affecting patient's functional outcome.  REHAB POTENTIAL: Fair CLINICAL DECISION MAKING: Evolving/moderate complexity EVALUATION COMPLEXITY: Medium  GOALS: Goals reviewed with patient? Yes  SHORT TERM GOALS: Target date: 06/20/24  Pt to report resuming  physical activity as desired in a modified capacity that has no negative effect on low back or leg symptoms pain.  Baseline: unable to exercise due to symptoms response  Goal status: INITIAL  2.  Pt to report improved weekly average pain score by >2 points.  Baseline: 5/10  Goal status: INITIAL  LONG TERM GOALS: Target date: 07/21/24  Pt to report increase in ability to perform community AMB for IADL and leisure without significant increase in back pain symptoms.  Baseline: limited compared to baseline.  Goal status: INITIAL  2.  MODI score improvement by >15% to indicate reduced back-related disability.  Baseline: MODI 40%  Goal status: INITIAL  3.  Pt to demonstrate >1421ft without increase in back pain >1/10.  Baseline:  Goal status: INITIAL  4.  Pt to demonstrate isometric deadbug 15x15sec without increased back pain to demonstrate improved abd motor function and improved tolerance to core muscle use.  Baseline: 30sec with moderate effort and moderate pain increase.  Goal status: INITIAL  5.  Pt to report able to return to reduced volume treadmill walking workouts without >2/10 low back increase same day.  Baseline:  Goal status: INITIAL  PLAN: PT FREQUENCY: 1-2x/week  PT DURATION: 6 weeks PLANNED INTERVENTIONS: 97110-Therapeutic exercises, 97530- Therapeutic activity, 97112- Neuromuscular re-education, 97535- Self Care, 02859- Manual therapy, G0283- Electrical stimulation (unattended), Patient/Family education, Balance training, Stair training, Joint mobilization, Spinal mobilization, Cognitive remediation, Cryotherapy, and Moist heat. PLAN FOR NEXT SESSION: complete remaining tests and measures, expand on HEP, FU on access to pool for aerobic exercise.    Melaysia Streed, PT, DPT 06/18/2024, 9:55 AM  9:55 AM, 06/18/24  Physical Therapist - [place] [phone] (Office)

## 2024-06-20 ENCOUNTER — Ambulatory Visit

## 2024-06-20 DIAGNOSIS — R262 Difficulty in walking, not elsewhere classified: Secondary | ICD-10-CM

## 2024-06-20 DIAGNOSIS — M5459 Other low back pain: Secondary | ICD-10-CM | POA: Diagnosis not present

## 2024-06-20 NOTE — Therapy (Signed)
 OUTPATIENT PHYSICAL THERAPY TREATMENT  Patient Name: Regina Whitaker MRN: [id number] DOB:10-07-1976, 48 y.o., female Today's Date: 06/20/2024  END OF SESSION:  PT End of Session - 06/20/24 0906     Visit Number 8    Number of Visits 12    Date for PT Re-Evaluation 07/02/24    Authorization Type [employer] Managed    Authorization Time Period 05/21/24-07/02/24    Progress Note Due on Visit 10    PT Start Time 0906    PT Stop Time 0950    PT Time Calculation (min) 44 min    Activity Tolerance Patient tolerated treatment well    Behavior During Therapy [facility] for tasks assessed/performed                 Past Medical History:  Diagnosis Date   Anxiety    Bleeding nose    Calf pain    when walking   Cough    Depression    Frequent headaches    GERD (gastroesophageal reflux disease)    IBS (irritable bowel syndrome)    Nerve pain    Sinus problem    Past Surgical History:  Procedure Laterality Date   arm surgery     right   CESAREAN SECTION     MOUTH SURGERY     Patient Active Problem List   Diagnosis Date Noted   Chronic migraine w/o aura w/o status migrainosus, not intractable 09/05/2023   Tic 09/05/2023   Daytime somnolence 05/20/2019   Prediabetes 04/26/2019   Chronic anxiety 12/12/2018   GERD without esophagitis 12/12/2018   Mild intermittent asthma without complication 12/12/2018    PCP: [facility] LABOR, MD REFERRING PROVIDER: Con Hamel, MD  REFERRING DIAG: Lumbar radiculopathy  Rationale for Evaluation and Treatment: Rehabilitation  THERAPY DIAG:  Other low back pain  Difficulty in walking, not elsewhere classified  ONSET DATE: Chronic with acute exacerbation x6-8 months  SUBJECTIVE:                                                                                                                                                                                           SUBJECTIVE STATEMENT: Back is about a 7/10 currently. Was a 3-4/10 low  back pain last night when she went to bed. Woke up with a 7/10 back pain this morning. Spent 4 hours sitting on a hard chair after last session, took a longer time for back to bother her compared to before starting PT.    PERTINENT HISTORY:  47yoF with acute on chronic low back pain, referred [name]. Pt has done PT in the past for  prior episodes, has ultimately needed epidural injections in the past. Pt has constant central left sided low back pain occasional shooting pain in left lower lateral leg along fibularis distribution. Pt has been limited from strength/conditioning type exercise for years due to fears of aggravating back spasms, but also has been unable to perform treadmill walking for exercise. 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   PAIN: Aggravating factors: sitting on a hard chair for a long period of time (1-2 hours) (soft chair is fine), bending over to do chores, standing for about 10-15 minutes.    Are you having pain?  Yes; 5/10 current, 5/10 best pain in past 7 day average, 6-8/10 worse pain (low back, central lower, left); leg pain symptoms are intermittent and shooting only.   PRECAUTIONS: None RED FLAGS: Pt denies any change in bowel/bladder, denies recent weight loss unexplained in origin, denies any nigh sweats, denies night terrors, denies saddle paresthesias.   WEIGHT BEARING RESTRICTIONS: None  FALLS:  Has patient fallen in last 6 months? Nope  OCCUPATION: Works full time, Health and safety inspector job, Designer, multimedia social services PLOF: prior to this episode, walking for exercise on treadmill; (~8 months prior); unlimited AMB in community.   PATIENT GOALS:  Be able to freely participate in community mobility ad lib without end-of-day back pain Be able to resume self selected fitness activity for the  self management of wellness   OBJECTIVE:  Note: Objective measures were completed at Evaluation unless otherwise noted.  DIAGNOSTIC FINDINGS:  Only able to find imaging results from as recent as 2017  IMPRESSION: L3-4: Shallow left posterolateral disc protrusion. Mild facet hypertrophy. Narrowing of the left lateral recess that could possibly cause neural compression.   L4-5:  Disc bulge.  No compressive stenosis.   L5-S1: Disc bulge. Mild facet hypertrophy more on the left. Narrowing of the subarticular lateral recess on the left that could affect the S1 nerve root.    Electronically Signed   By: [name] M.D.   On: 11/13/2016 10:53  PATIENT SURVEYS:  MODI: 40%   SENSATION: Pt denies any acute or chronic change to sensorium; assessment deferred to referring provider.   LUMBAR ROM:  Lumbar flexion - (standing toe touch) - no effect on symptoms - maintains slight lumbar lordosis Lumbar extension - (standing arch, hands on pelvis) - no effect on symptoms - lordosis to end range > in lower segments than in upper T10-L2  Lumbar Rotation Right - (standing, WBOS, full body rotation) - no effect on symptoms - ~80 degrees rotation at T1 level (WNL)  Lumbar Rotation Left - (standing, WBOS, full body rotation) - no effect on symptoms - ~80 degrees rotation at T1 level (WNL)  LUMBAR SPECIAL TESTS:  Passive Straight Leg Raise: Left  -negative SLR, negative with ankle DF, negative with ankle DF + cervical flexion  Long sit test suggests anterior nutation L innominate (06/12/2024)   FUNCTIONAL TESTS:  -Overground AMB gait assessment, 4x55ft: No device used, gait speed appropriate for distance, setting, no LOB, no frank antalgia   grossly unremarkable, very slight reduction in DF time after heel strike Left, very minimal foot slap Left, cannot attribute to frank weakness issue -Tip toe walking, heel walking (L5/S1 screening): WNl  Pt able to demonstrate both over 53ft  without frank asymmetry of motor function ankles  -  30 sec chair rise: 10x (aware of, but  not limited by chronic pain in low back, chronic soreness/joint noise in Rt knee (DJD). No change in symptoms.   -Isometric deadbug: holds for 30sec, moderate effort, moderate increase in back discomfort.    Today's Intervention 06/20/24     Therapeutic exercise  Forward step up onto first regular step with L LE and B UE assist  10x2 to promote L glute max muscle strengthening.    Supine SKTC   L 10x5 seconds for 2 sets    Then with hip extension isometrics     L 5x5 seconds for 2 sets    Increased low back pain  Reclined  Hooklying    Clamshell isometrics with strap 1 min at 40 % effort with 1 minute rest 5x     Crunch 10x3  Supine bridge 10x2  Supine hip flexor stretch off the table   R 30 seconds x 2  L 30 seconds x 2      Improved exercise technique, movement at target joints, use of target muscles after mod verbal, visual, tactile cues.     PATIENT EDUCATION:  Education details: Home lumbar flexion stretch; using pool for physical activity until able to resume treadmill walking.  Person educated: Jenan  Education method: collaborative learning, deliberate practice, positive reinforcement, explicit instruction, establish rules. Education comprehension: Good  HOME EXERCISE PROGRAM: -Lumbar flexion/decompression stretch in bed or floor x5 minutes, QD; 05/22/24  -Supine, legs elevated, hips and knees at 90 and 90: (lumbar flexion stretch and spinal decompression) x 3 minutes  Pt denies any subjective stretch in low back, attribute to >typical hip joint ROM.   Discussed a few ways to perform at home, asking to perform daily x5 minutes.   Pt was recommended to perform this exercise on her bed instead of the floor. Pt verbalized understanding 05/23/2024  Reclined   Hooklying   Hip extension with leg straight with 1-2 pillows under straight leg    L 10x10 seconds for 3  sets    R 10x10 seconds for 3 sets    Access Code: [email or web address] URL: [email or web address] Date: 05/23/2024 Prepared by: Emil Glassman  Exercises - Seated Transversus Abdominis Bracing  - 8 x daily - 7 x weekly - 1 sets - 5 reps - 30 seconds hold - Seated Gluteal Sets  - 8 x daily - 7 x weekly - 1 sets - 5 reps - 30 seconds hold  - Supine Lower Trunk Rotation  - 1 x daily - 7 x weekly - 3 sets - 10 reps - 5 seconds hold   - Modified Thomas Stretch  - 3 x daily - 7 x weekly - 1 sets - 5 reps - 30 seconds hold  - Curl Up with Arms Crossed  - 1 x daily - 7 x weekly - 2 sets - 10 reps - Forward Step Up with Counter Support  - 1 x daily - 7 x weekly - 3 sets - 10 reps - Standing Hip Abduction with Counter Support  - 1 x daily - 7 x weekly - 2 sets - 10 reps - Supine Bridge  - 1 x daily - 7 x weekly - 3 sets - 10 reps - Hooklying Bilateral Isometric Clamshell  - 3 x daily - 7 x weekly - 1 sets - 5 reps - 1 minute hold   ASSESSMENT:  CLINICAL IMPRESSION: TTP L greater trochanter, provided isometric loading to promote healing. Continued working on trunk and  glute strengthening to decrease stress to low back. Decreased low back and L lateral hip pain reported after session.   Pt will benefit from continued skilled physical therapy services to decrease pain, improve strength and function.       OBJECTIVE IMPAIRMENTS: Decreased knowledge of condition, decreased use of DME, decreased mobility, difficulty walking, decreased strength, decreased ROM. ACTIVITY LIMITATIONS: Lifting, standing, walking, squatting, transfers, locomotion level PARTICIPATION LIMITATIONS: Cleaning, laundry, interpersonal relationships, driving, yardwork, community activity.  PERSONAL FACTORS: Age, behavior pattern, education, past/current experiences, transportation, profession  are also affecting patient's functional outcome.  REHAB POTENTIAL: Fair CLINICAL DECISION MAKING: Evolving/moderate  complexity EVALUATION COMPLEXITY: Medium  GOALS: Goals reviewed with patient? Yes  SHORT TERM GOALS: Target date: 06/20/24  Pt to report resuming physical activity as desired in a modified capacity that has no negative effect on low back or leg symptoms pain.  Baseline: unable to exercise due to symptoms response  Goal status: INITIAL  2.  Pt to report improved weekly average pain score by >2 points.  Baseline: 5/10  Goal status: INITIAL  LONG TERM GOALS: Target date: 07/21/24  Pt to report increase in ability to perform community AMB for IADL and leisure without significant increase in back pain symptoms.  Baseline: limited compared to baseline.  Goal status: INITIAL  2.  MODI score improvement by >15% to indicate reduced back-related disability.  Baseline: MODI 40%  Goal status: INITIAL  3.  Pt to demonstrate >1457ft without increase in back pain >1/10.  Baseline:  Goal status: INITIAL  4.  Pt to demonstrate isometric deadbug 15x15sec without increased back pain to demonstrate improved abd motor function and improved tolerance to core muscle use.  Baseline: 30sec with moderate effort and moderate pain increase.  Goal status: INITIAL  5.  Pt to report able to return to reduced volume treadmill walking workouts without >2/10 low back increase same day.  Baseline:  Goal status: INITIAL  PLAN: PT FREQUENCY: 1-2x/week  PT DURATION: 6 weeks PLANNED INTERVENTIONS: 97110-Therapeutic exercises, 97530- Therapeutic activity, 97112- Neuromuscular re-education, 97535- Self Care, 02859- Manual therapy, G0283- Electrical stimulation (unattended), Patient/Family education, Balance training, Stair training, Joint mobilization, Spinal mobilization, Cognitive remediation, Cryotherapy, and Moist heat. PLAN FOR NEXT SESSION: complete remaining tests and measures, expand on HEP, FU on access to pool for aerobic exercise.    Etola Mull, PT, DPT 06/20/2024, 10:18 AM  10:18 AM,  06/20/24  Physical Therapist - [place] [phone] (Office)

## 2024-06-24 ENCOUNTER — Ambulatory Visit

## 2024-06-27 ENCOUNTER — Ambulatory Visit

## 2024-06-27 DIAGNOSIS — M5459 Other low back pain: Secondary | ICD-10-CM | POA: Diagnosis not present

## 2024-06-27 DIAGNOSIS — R262 Difficulty in walking, not elsewhere classified: Secondary | ICD-10-CM

## 2024-06-27 NOTE — Therapy (Signed)
 OUTPATIENT PHYSICAL THERAPY TREATMENT  Patient Name: Regina Whitaker MRN: [id number] DOB:[birth year], 48 y.o., female Today's Date: 06/27/2024  END OF SESSION:  PT End of Session - 06/27/24 0956     Visit Number 9    Number of Visits 12    Date for PT Re-Evaluation 07/02/24    Authorization Type [employer] Managed    Authorization Time Period 05/21/24-07/02/24    Progress Note Due on Visit 10    PT Start Time 0957    PT Stop Time 1030    PT Time Calculation (min) 33 min    Activity Tolerance Patient tolerated treatment well    Behavior During Therapy [facility] for tasks assessed/performed                 Past Medical History:  Diagnosis Date   Anxiety    Bleeding nose    Calf pain    when walking   Cough    Depression    Frequent headaches    GERD (gastroesophageal reflux disease)    IBS (irritable bowel syndrome)    Nerve pain    Sinus problem    Past Surgical History:  Procedure Laterality Date   arm surgery     right   CESAREAN SECTION     MOUTH SURGERY     Patient Active Problem List   Diagnosis Date Noted   Chronic migraine w/o aura w/o status migrainosus, not intractable 09/05/2023   Tic 09/05/2023   Daytime somnolence 05/20/2019   Prediabetes 04/26/2019   Chronic anxiety 12/12/2018   GERD without esophagitis 12/12/2018   Mild intermittent asthma without complication 12/12/2018    PCP: Silver Lamar LABOR, MD REFERRING PROVIDER: Con Hamel, MD  REFERRING DIAG: Lumbar radiculopathy  Rationale for Evaluation and Treatment: Rehabilitation  THERAPY DIAG:  Other low back pain  Difficulty in walking, not elsewhere classified  ONSET DATE: Chronic with acute exacerbation x6-8 months  SUBJECTIVE:                                                                                                                                                                                           SUBJECTIVE STATEMENT: Pt reports pain not too bad recording it as a 3/10  NPS. Apologizes being late to appointment today.    PERTINENT HISTORY:  47yoF with acute on chronic low back pain, referred by [name]. Pt has done PT in the past for prior episodes, has ultimately needed epidural injections in the past. Pt has constant central left sided low back pain occasional shooting pain in left lower lateral leg along fibularis distribution. Pt has been limited from  strength/conditioning type exercise for years due to fears of aggravating back spasms, but also has been unable to perform treadmill walking for exercise. 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   PAIN: Aggravating factors: sitting on a hard chair for a long period of time (1-2 hours) (soft chair is fine), bending over to do chores, standing for about 10-15 minutes.    Are you having pain?  Yes; 5/10 current, 5/10 best pain in past 7 day average, 6-8/10 worse pain (low back, central lower, left); leg pain symptoms are intermittent and shooting only.   PRECAUTIONS: None RED FLAGS: Pt denies any change in bowel/bladder, denies recent weight loss unexplained in origin, denies any nigh sweats, denies night terrors, denies saddle paresthesias.   WEIGHT BEARING RESTRICTIONS: None  FALLS:  Has patient fallen in last 6 months? Nope  OCCUPATION: Works full time, Health and safety inspector job, Designer, multimedia social services PLOF: prior to this episode, walking for exercise on treadmill; (~8 months prior); unlimited AMB in community.   PATIENT GOALS:  Be able to freely participate in community mobility ad lib without end-of-day back pain Be able to resume self selected fitness activity for the self management of wellness   OBJECTIVE:  Note: Objective measures were completed at Evaluation unless otherwise noted.  DIAGNOSTIC FINDINGS:  Only able to find  imaging results from as recent as 2017  IMPRESSION: L3-4: Shallow left posterolateral disc protrusion. Mild facet hypertrophy. Narrowing of the left lateral recess that could possibly cause neural compression.   L4-5:  Disc bulge.  No compressive stenosis.   L5-S1: Disc bulge. Mild facet hypertrophy more on the left. Narrowing of the subarticular lateral recess on the left that could affect the S1 nerve root.    Electronically Signed   By: [name] M.D.   On: 11/13/2016 10:53  PATIENT SURVEYS:  MODI: 40%   SENSATION: Pt denies any acute or chronic change to sensorium; assessment deferred to referring provider.   LUMBAR ROM:  Lumbar flexion - (standing toe touch) - no effect on symptoms - maintains slight lumbar lordosis Lumbar extension - (standing arch, hands on pelvis) - no effect on symptoms - lordosis to end range > in lower segments than in upper T10-L2  Lumbar Rotation Right - (standing, WBOS, full body rotation) - no effect on symptoms - ~80 degrees rotation at T1 level (WNL)  Lumbar Rotation Left - (standing, WBOS, full body rotation) - no effect on symptoms - ~80 degrees rotation at T1 level (WNL)  LUMBAR SPECIAL TESTS:  Passive Straight Leg Raise: Left  -negative SLR, negative with ankle DF, negative with ankle DF + cervical flexion  Long sit test suggests anterior nutation L innominate (06/12/2024)   FUNCTIONAL TESTS:  -Overground AMB gait assessment, 4x77ft: No device used, gait speed appropriate for distance, setting, no LOB, no frank antalgia   grossly unremarkable, very slight reduction in DF time after heel strike Left, very minimal foot slap Left, cannot attribute to frank weakness issue -Tip toe walking, heel walking (L5/S1 screening): WNl  Pt able to demonstrate both over 38ft without frank asymmetry of motor function ankles  -30 sec chair rise: 10x (aware of, but  not limited by chronic pain in low back, chronic soreness/joint noise in Rt  knee (DJD). No change in symptoms.   -Isometric deadbug: holds for 30sec,  moderate effort, moderate increase in back discomfort.    Today's Intervention 06/27/24   Therapeutic exercise:   Hook lying bridges: 3x10   Hook lying clam shells against GTB: 3x8, 3 sec holds   Reclined/hook lying crunch: 3x10    Supine hip flexor stretch off the table    R 30 seconds x 2   L 30 seconds x 2   LLE step up to 6 step: 3x10, light BUE support     Improved exercise technique, movement at target joints, use of target muscles after mod verbal, visual, tactile cues.     PATIENT EDUCATION:  Education details: Home lumbar flexion stretch; using pool for physical activity until able to resume treadmill walking.  Person educated: Amire  Education method: collaborative learning, deliberate practice, positive reinforcement, explicit instruction, establish rules. Education comprehension: Good  HOME EXERCISE PROGRAM: -Lumbar flexion/decompression stretch in bed or floor x5 minutes, QD; 05/22/24  -Supine, legs elevated, hips and knees at 90 and 90: (lumbar flexion stretch and spinal decompression) x 3 minutes  Pt denies any subjective stretch in low back, attribute to >typical hip joint ROM.   Discussed a few ways to perform at home, asking to perform daily x5 minutes.   Pt was recommended to perform this exercise on her bed instead of the floor. Pt verbalized understanding 05/23/2024  Reclined   Hooklying   Hip extension with leg straight with 1-2 pillows under straight leg    L 10x10 seconds for 3 sets    R 10x10 seconds for 3 sets    Access Code: [email or web address] URL: [email or web address] Date: 05/23/2024 Prepared by: Emil Glassman  Exercises - Seated Transversus Abdominis Bracing  - 8 x daily - 7 x weekly - 1 sets - 5 reps - 30 seconds hold - Seated Gluteal Sets  - 8 x daily - 7 x weekly - 1 sets - 5 reps - 30 seconds hold  - Supine Lower Trunk Rotation  - 1 x daily - 7 x  weekly - 3 sets - 10 reps - 5 seconds hold   - Modified Thomas Stretch  - 3 x daily - 7 x weekly - 1 sets - 5 reps - 30 seconds hold  - Curl Up with Arms Crossed  - 1 x daily - 7 x weekly - 2 sets - 10 reps - Forward Step Up with Counter Support  - 1 x daily - 7 x weekly - 3 sets - 10 reps - Standing Hip Abduction with Counter Support  - 1 x daily - 7 x weekly - 2 sets - 10 reps - Supine Bridge  - 1 x daily - 7 x weekly - 3 sets - 10 reps - Hooklying Bilateral Isometric Clamshell  - 3 x daily - 7 x weekly - 1 sets - 5 reps - 1 minute hold   ASSESSMENT:  CLINICAL IMPRESSION: Continuing PT POC working on gluteal strengthening and hip mobility. Session limited due to pt arriving late to session. Completes all exercises with great motivation and tolerance. Pain low at beginning of session and remains low at end of session grossly unchanged. Pt will warrant progress note next session to assess progress towards POC. Pt will benefit from continued skilled physical therapy services to decrease pain, improve strength and function.     OBJECTIVE IMPAIRMENTS: Decreased knowledge of condition, decreased use of DME, decreased mobility, difficulty walking, decreased strength, decreased ROM. ACTIVITY LIMITATIONS: Lifting, standing, walking, squatting, transfers, locomotion level PARTICIPATION LIMITATIONS: Cleaning, laundry, interpersonal relationships,  driving, yardwork, community activity.  PERSONAL FACTORS: Age, behavior pattern, education, past/current experiences, transportation, profession  are also affecting patient's functional outcome.  REHAB POTENTIAL: Fair CLINICAL DECISION MAKING: Evolving/moderate complexity EVALUATION COMPLEXITY: Medium  GOALS: Goals reviewed with patient? Yes  SHORT TERM GOALS: Target date: 06/20/24  Pt to report resuming physical activity as desired in a modified capacity that has no negative effect on low back or leg symptoms pain.  Baseline: unable to exercise due  to symptoms response  Goal status: INITIAL  2.  Pt to report improved weekly average pain score by >2 points.  Baseline: 5/10  Goal status: INITIAL  LONG TERM GOALS: Target date: 07/21/24  Pt to report increase in ability to perform community AMB for IADL and leisure without significant increase in back pain symptoms.  Baseline: limited compared to baseline.  Goal status: INITIAL  2.  MODI score improvement by >15% to indicate reduced back-related disability.  Baseline: MODI 40%  Goal status: INITIAL  3.  Pt to demonstrate >1430ft without increase in back pain >1/10.  Baseline:  Goal status: INITIAL  4.  Pt to demonstrate isometric deadbug 15x15sec without increased back pain to demonstrate improved abd motor function and improved tolerance to core muscle use.  Baseline: 30sec with moderate effort and moderate pain increase.  Goal status: INITIAL  5.  Pt to report able to return to reduced volume treadmill walking workouts without >2/10 low back increase same day.  Baseline:  Goal status: INITIAL  PLAN: PT FREQUENCY: 1-2x/week  PT DURATION: 6 weeks PLANNED INTERVENTIONS: 97110-Therapeutic exercises, 97530- Therapeutic activity, 97112- Neuromuscular re-education, 97535- Self Care, 02859- Manual therapy, G0283- Electrical stimulation (unattended), Patient/Family education, Balance training, Stair training, Joint mobilization, Spinal mobilization, Cognitive remediation, Cryotherapy, and Moist heat.  PLAN FOR NEXT SESSION: Progress note  Regina Whitaker. Fairly IV, PT, DPT Physical Therapist- [place]  [facility] 06/27/2024, 10:49 AM

## 2024-07-01 ENCOUNTER — Ambulatory Visit: Attending: Family Medicine

## 2024-07-01 ENCOUNTER — Telehealth: Payer: Self-pay

## 2024-07-01 DIAGNOSIS — R262 Difficulty in walking, not elsewhere classified: Secondary | ICD-10-CM | POA: Insufficient documentation

## 2024-07-01 DIAGNOSIS — M5459 Other low back pain: Secondary | ICD-10-CM | POA: Insufficient documentation

## 2024-07-01 NOTE — Telephone Encounter (Signed)
 No show. Called patient who said that she forgot about her appointment. Also has to cancel her 07/04/24 appointment because she will be out of town. Had a good weekend for her back. 3/10 or less average pain for the past week.

## 2024-07-04 ENCOUNTER — Ambulatory Visit

## 2024-07-08 ENCOUNTER — Ambulatory Visit

## 2024-07-08 DIAGNOSIS — M5459 Other low back pain: Secondary | ICD-10-CM | POA: Diagnosis present

## 2024-07-08 DIAGNOSIS — R262 Difficulty in walking, not elsewhere classified: Secondary | ICD-10-CM

## 2024-07-08 NOTE — Therapy (Signed)
 OUTPATIENT PHYSICAL THERAPY TREATMENT And  Progress Report (05/21/2024 - 07/08/2024) Re certification (for today's visit) And  Discharge Summary  Patient Name: Regina Whitaker MRN: [id number] DOB:15-Jan-1976, 48 y.o., female Today's Date: 07/08/2024  END OF SESSION:  PT End of Session - 07/08/24 0907     Visit Number 10    Number of Visits 12    Date for PT Re-Evaluation 07/02/24    Authorization Type [employer] Managed    Authorization Time Period 05/21/24-07/02/24    Progress Note Due on Visit 10    PT Start Time 0907    PT Stop Time 0947    PT Time Calculation (min) 40 min    Activity Tolerance Patient tolerated treatment well    Behavior During Therapy [facility] for tasks assessed/performed                  Past Medical History:  Diagnosis Date   Anxiety    Bleeding nose    Calf pain    when walking   Cough    Depression    Frequent headaches    GERD (gastroesophageal reflux disease)    IBS (irritable bowel syndrome)    Nerve pain    Sinus problem    Past Surgical History:  Procedure Laterality Date   arm surgery     right   CESAREAN SECTION     MOUTH SURGERY     Patient Active Problem List   Diagnosis Date Noted   Chronic migraine w/o aura w/o status migrainosus, not intractable 09/05/2023   Tic 09/05/2023   Daytime somnolence 05/20/2019   Prediabetes 04/26/2019   Chronic anxiety 12/12/2018   GERD without esophagitis 12/12/2018   Mild intermittent asthma without complication 12/12/2018    PCP: Silver [facility] LABOR, MD REFERRING PROVIDER: Con Hamel, MD  REFERRING DIAG: Lumbar radiculopathy  Rationale for Evaluation and Treatment: Rehabilitation  THERAPY DIAG:  Other low back pain  Difficulty in walking, not elsewhere classified  ONSET DATE: Chronic with acute exacerbation x6-8 months  SUBJECTIVE:                                                                                                                                                                                            SUBJECTIVE STATEMENT: Just returned from a 3 day training. Pt sat in a semi-hard chair for 3 days. Did her HEP which helped. 3/10 low back pain currently. Feels like she can graduate to her HEP after today's visit.      PERTINENT HISTORY:  47yoF with acute on chronic low back pain, referred by [name]. Pt has done PT in  the past for prior episodes, has ultimately needed epidural injections in the past. Pt has constant central left sided low back pain occasional shooting pain in left lower lateral leg along fibularis distribution. Pt has been limited from strength/conditioning type exercise for years due to fears of aggravating back spasms, but also has been unable to perform treadmill walking for exercise. 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   PAIN: Aggravating factors: sitting on a hard chair for a long period of time (1-2 hours) (soft chair is fine), bending over to do chores, standing for about 10-15 minutes.    Are you having pain?  Yes; 5/10 current, 5/10 best pain in past 7 day average, 6-8/10 worse pain (low back, central lower, left); leg pain symptoms are intermittent and shooting only.   PRECAUTIONS: None RED FLAGS: Pt denies any change in bowel/bladder, denies recent weight loss unexplained in origin, denies any nigh sweats, denies night terrors, denies saddle paresthesias.   WEIGHT BEARING RESTRICTIONS: None  FALLS:  Has patient fallen in last 6 months? Nope  OCCUPATION: Works full time, Health and safety inspector job, Designer, multimedia social services PLOF: prior to this episode, walking for exercise on treadmill; (~8 months prior); unlimited AMB in community.   PATIENT GOALS:  Be able to freely participate in community mobility ad lib without end-of-day back pain Be able to resume  self selected fitness activity for the self management of wellness   OBJECTIVE:  Note: Objective measures were completed at Evaluation unless otherwise noted.  DIAGNOSTIC FINDINGS:  Only able to find imaging results from as recent as 2017  IMPRESSION: L3-4: Shallow left posterolateral disc protrusion. Mild facet hypertrophy. Narrowing of the left lateral recess that could possibly cause neural compression.   L4-5:  Disc bulge.  No compressive stenosis.   L5-S1: Disc bulge. Mild facet hypertrophy more on the left. Narrowing of the subarticular lateral recess on the left that could affect the S1 nerve root.    Electronically Signed   By: [name] M.D.   On: 11/13/2016 10:53  PATIENT SURVEYS:  MODI: 40%   SENSATION: Pt denies any acute or chronic change to sensorium; assessment deferred to referring provider.   LUMBAR ROM:  Lumbar flexion - (standing toe touch) - no effect on symptoms - maintains slight lumbar lordosis Lumbar extension - (standing arch, hands on pelvis) - no effect on symptoms - lordosis to end range > in lower segments than in upper T10-L2  Lumbar Rotation Right - (standing, WBOS, full body rotation) - no effect on symptoms - ~80 degrees rotation at T1 level (WNL)  Lumbar Rotation Left - (standing, WBOS, full body rotation) - no effect on symptoms - ~80 degrees rotation at T1 level (WNL)  LUMBAR SPECIAL TESTS:  Passive Straight Leg Raise: Left  -negative SLR, negative with ankle DF, negative with ankle DF + cervical flexion  Long sit test suggests anterior nutation L innominate (06/12/2024)   FUNCTIONAL TESTS:  -Overground AMB gait assessment, 4x51ft: No device used, gait speed appropriate for distance, setting, no LOB, no frank antalgia   grossly unremarkable, very slight reduction in DF time after heel strike Left, very minimal foot slap Left, cannot attribute to frank weakness issue -Tip toe walking, heel walking (L5/S1 screening): WNl  Pt  able to demonstrate both over 16ft without frank asymmetry of motor  function ankles  -30 sec chair rise: 10x (aware of, but  not limited by chronic pain in low back, chronic soreness/joint noise in Rt knee (DJD). No change in symptoms.   -Isometric deadbug: holds for 30sec, moderate effort, moderate increase in back discomfort.    Today's Intervention 07/08/24    Therapeutic exercise:   Supine isometric dead bug 15 seconds x 5 for 3 sets  Increased time (most of the session) for isometric dead bug exercise to allow for therapeutic rest breaks and muscle recovery.    Pt able to perform all 15 with 15 second holds without increase in back pain.    Performed for goal assessment as well  Reviewed progress/current status with PT towards goals.   Reviewed POC: graduate to HEP      Improved exercise technique, movement at target joints, use of target muscles after mod verbal, visual, tactile cues.     PATIENT EDUCATION:  Education details: Home lumbar flexion stretch; using pool for physical activity until able to resume treadmill walking.  Person educated: Rasheena  Education method: collaborative learning, deliberate practice, positive reinforcement, explicit instruction, establish rules. Education comprehension: Good  HOME EXERCISE PROGRAM: -Lumbar flexion/decompression stretch in bed or floor x5 minutes, QD; 05/22/24  -Supine, legs elevated, hips and knees at 90 and 90: (lumbar flexion stretch and spinal decompression) x 3 minutes  Pt denies any subjective stretch in low back, attribute to >typical hip joint ROM.   Discussed a few ways to perform at home, asking to perform daily x5 minutes.   Pt was recommended to perform this exercise on her bed instead of the floor. Pt verbalized understanding 05/23/2024  Reclined   Hooklying   Hip extension with leg straight with 1-2 pillows under straight leg    L 10x10 seconds for 3 sets    R 10x10 seconds for 3 sets    Access Code:  [email or web address] URL: [email or web address] Date: 05/23/2024 Prepared by: Emil Glassman  Exercises - Seated Transversus Abdominis Bracing  - 8 x daily - 7 x weekly - 1 sets - 5 reps - 30 seconds hold - Seated Gluteal Sets  - 8 x daily - 7 x weekly - 1 sets - 5 reps - 30 seconds hold  - Supine Lower Trunk Rotation  - 1 x daily - 7 x weekly - 3 sets - 10 reps - 5 seconds hold   - Modified Thomas Stretch  - 3 x daily - 7 x weekly - 1 sets - 5 reps - 30 seconds hold  - Curl Up with Arms Crossed  - 1 x daily - 7 x weekly - 2 sets - 10 reps - Forward Step Up with Counter Support  - 1 x daily - 7 x weekly - 3 sets - 10 reps - Standing Hip Abduction with Counter Support  - 1 x daily - 7 x weekly - 2 sets - 10 reps - Supine Bridge  - 1 x daily - 7 x weekly - 3 sets - 10 reps - Hooklying Bilateral Isometric Clamshell  - 3 x daily - 7 x weekly - 1 sets - 5 reps - 1 minute hold   ASSESSMENT:  CLINICAL IMPRESSION: Pt demonstrates overall improved back pain, improved core strength and function since initial evaluation. Pt has made good progress with PT towards goals and demonstrates independence and consistency with her HEP. Skilled physical therapy services discharged after recert for today's visit with pt continuing her progress with her exercises at home.  OBJECTIVE IMPAIRMENTS: Decreased knowledge of condition, decreased use of DME, decreased mobility, difficulty walking, decreased strength, decreased ROM. ACTIVITY LIMITATIONS: Lifting, standing, walking, squatting, transfers, locomotion level PARTICIPATION LIMITATIONS: Cleaning, laundry, interpersonal relationships, driving, yardwork, community activity.  PERSONAL FACTORS: Age, behavior pattern, education, past/current experiences, transportation, profession  are also affecting patient's functional outcome.  REHAB POTENTIAL: Fair CLINICAL DECISION MAKING: Evolving/moderate complexity EVALUATION COMPLEXITY:  Medium  GOALS: Goals reviewed with patient? Yes  SHORT TERM GOALS: Target date: 06/20/24  Pt to report resuming physical activity as desired in a modified capacity that has no negative effect on low back or leg symptoms pain.  Baseline: unable to exercise due to symptoms response; Yes per pt (07/08/2024) Goal status: MET  2.  Pt to report improved weekly average pain score by >2 points.  Baseline: 5/10; 2-3/10 on average weekly (07/08/2024) Goal status: MET  LONG TERM GOALS: Target date: 07/21/24  Pt to report increase in ability to perform community AMB for IADL and leisure without significant increase in back pain symptoms.  Baseline: limited compared to baseline. Able to walk more per pt without significant increase in back pain symptoms (07/08/2024) Goal status: MET  2.  MODI score improvement by >15% to indicate reduced back-related disability.  Baseline: MODI 40%; 28 % (07/08/2024)  Goal status: MET  3.  Pt to demonstrate >1458ft without increase in back pain >1/10.  Baseline:  Goal status: Not performed secondary to time (07/08/2024)  4.  Pt to demonstrate isometric deadbug 15x15sec without increased back pain to demonstrate improved abd motor function and improved tolerance to core muscle use.  Baseline: 30sec with moderate effort and moderate pain increase; able to perform all 15 repetitions without increase in back pain (07/08/2024) Goal status: MET  5.  Pt to report able to return to reduced volume treadmill walking workouts without >2/10 low back increase same day.  Baseline: Has not yet returned to treadmill walking but pt states she should be able to (07/08/2024) Goal status: PROGRESSING  PLAN: PT FREQUENCY: 1-2x/week  PT DURATION: 6 weeks PLANNED INTERVENTIONS: 97110-Therapeutic exercises, 97530- Therapeutic activity, 97112- Neuromuscular re-education, 97535- Self Care, 02859- Manual therapy, G0283- Electrical stimulation (unattended), Patient/Family education,  Balance training, Stair training, Joint mobilization, Spinal mobilization, Cognitive remediation, Cryotherapy, and Moist heat.  PLAN FOR NEXT SESSION: Progress note   Thank you for your referral.   Emil Glassman PT, DPT Physical Therapist- [facility]  [facility] 07/08/2024, 9:56 AM
